# Patient Record
Sex: Female | Born: 1980 | Race: Black or African American | Hispanic: No | Marital: Single | Smoking: Former smoker
Health system: Southern US, Community
[De-identification: ages and names within clinical notes are randomized; demographics above are authoritative.]

## PROBLEM LIST (undated history)

## (undated) ENCOUNTER — Inpatient Hospital Stay (HOSPITAL_COMMUNITY): Payer: Self-pay

## (undated) DIAGNOSIS — I10 Essential (primary) hypertension: Secondary | ICD-10-CM

## (undated) DIAGNOSIS — F329 Major depressive disorder, single episode, unspecified: Secondary | ICD-10-CM

## (undated) DIAGNOSIS — R87629 Unspecified abnormal cytological findings in specimens from vagina: Secondary | ICD-10-CM

## (undated) DIAGNOSIS — Z789 Other specified health status: Secondary | ICD-10-CM

## (undated) HISTORY — DX: Essential (primary) hypertension: I10

## (undated) HISTORY — DX: Unspecified abnormal cytological findings in specimens from vagina: R87.629

## (undated) HISTORY — PX: TUBAL LIGATION: SHX77

## (undated) HISTORY — PX: NO PAST SURGERIES: SHX2092

---

## 2006-11-04 ENCOUNTER — Inpatient Hospital Stay (HOSPITAL_COMMUNITY): Admission: AD | Admit: 2006-11-04 | Discharge: 2006-11-04 | Payer: Self-pay | Admitting: Obstetrics & Gynecology

## 2008-09-01 ENCOUNTER — Ambulatory Visit (HOSPITAL_COMMUNITY): Admission: RE | Admit: 2008-09-01 | Discharge: 2008-09-01 | Payer: Self-pay | Admitting: Obstetrics

## 2008-11-03 ENCOUNTER — Ambulatory Visit (HOSPITAL_COMMUNITY): Admission: RE | Admit: 2008-11-03 | Discharge: 2008-11-03 | Payer: Self-pay | Admitting: Obstetrics

## 2008-12-09 ENCOUNTER — Observation Stay (HOSPITAL_COMMUNITY): Admission: AD | Admit: 2008-12-09 | Discharge: 2008-12-10 | Payer: Self-pay | Admitting: Obstetrics & Gynecology

## 2008-12-20 ENCOUNTER — Inpatient Hospital Stay (HOSPITAL_COMMUNITY): Admission: AD | Admit: 2008-12-20 | Discharge: 2008-12-20 | Payer: Self-pay | Admitting: Obstetrics & Gynecology

## 2008-12-26 ENCOUNTER — Inpatient Hospital Stay (HOSPITAL_COMMUNITY): Admission: AD | Admit: 2008-12-26 | Discharge: 2008-12-28 | Payer: Self-pay | Admitting: Obstetrics

## 2011-03-20 LAB — CBC
HCT: 33.4 % — ABNORMAL LOW (ref 36.0–46.0)
HCT: 32.2 % — ABNORMAL LOW (ref 36.0–46.0)
HCT: 35.9 % — ABNORMAL LOW (ref 36.0–46.0)
Hemoglobin: 11.4 g/dL — ABNORMAL LOW (ref 12.0–15.0)
MCHC: 34.1 g/dL (ref 30.0–36.0)
MCV: 100.3 fL — ABNORMAL HIGH (ref 78.0–100.0)
MCV: 98.7 fL (ref 78.0–100.0)
Platelets: 137 10*3/uL — ABNORMAL LOW (ref 150–400)
RBC: 3.26 MIL/uL — ABNORMAL LOW (ref 3.87–5.11)
RBC: 3.58 MIL/uL — ABNORMAL LOW (ref 3.87–5.11)
WBC: 10.2 10*3/uL (ref 4.0–10.5)
WBC: 10.6 10*3/uL — ABNORMAL HIGH (ref 4.0–10.5)
WBC: 11.1 10*3/uL — ABNORMAL HIGH (ref 4.0–10.5)

## 2011-03-20 LAB — RPR: RPR Ser Ql: NONREACTIVE

## 2011-03-20 LAB — COMPREHENSIVE METABOLIC PANEL
Albumin: 2.7 g/dL — ABNORMAL LOW (ref 3.5–5.2)
Chloride: 106 mEq/L (ref 96–112)
GFR calc non Af Amer: >60 mL/min (ref >60)
Potassium: 3.4 mEq/L — ABNORMAL LOW (ref 3.5–5.1)
Sodium: 135 mEq/L (ref 135–145)
Total Bilirubin: 0.7 mg/dL (ref 0.3–1.2)
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
## 2012-05-17 ENCOUNTER — Encounter (HOSPITAL_COMMUNITY): Payer: Self-pay | Admitting: Emergency Medicine

## 2012-05-17 ENCOUNTER — Emergency Department (HOSPITAL_COMMUNITY)
Admission: EM | Admit: 2012-05-17 | Discharge: 2012-05-17 | Disposition: A | Discharge status: Home / Self Care | Payer: No Typology Code available for payment source | Attending: Emergency Medicine | Admitting: Emergency Medicine

## 2012-05-17 DIAGNOSIS — S139XXA Sprain of joints and ligaments of unspecified parts of neck, initial encounter: Secondary | ICD-10-CM | POA: Insufficient documentation

## 2012-05-17 DIAGNOSIS — T148XXA Other injury of unspecified body region, initial encounter: Secondary | ICD-10-CM

## 2012-05-17 DIAGNOSIS — Y92410 Unspecified street and highway as the place of occurrence of the external cause: Secondary | ICD-10-CM | POA: Insufficient documentation

## 2012-05-17 MED ORDER — NAPROXEN 500 MG PO TABS: 500.0000 mg | ORAL_TABLET | Freq: Two times a day (BID) | ORAL | Status: AC

## 2012-05-17 MED ORDER — CYCLOBENZAPRINE HCL 10 MG PO TABS: 10.0000 mg | ORAL_TABLET | Freq: Three times a day (TID) | ORAL | Status: AC | PRN
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
## 2012-05-18 NOTE — ED Provider Notes (Signed)
Medical screening examination/treatment/procedure(s) were performed by non-physician practitioner and as supervising physician I was immediately available for consultation/collaboration.   Forbes Cellar, MD 05/18/12 1627

## 2013-12-04 NOTE — L&D Delivery Note (Signed)
Delivery Note At 5:10 PM a viable female was delivered via Vaginal, Spontaneous Delivery (Presentation: ;  ).  APGAR: , ; weight .   Placenta status: , .  Cord:  with the following complications: .  Cord pH: not done  Anesthesia:   Episiotomy:  Lacerations:  Suture Repair: 2.0 Est. Blood Loss (mL):   Mom to postpartum.  Baby to Couplet care / Skin to Skin.  MARSHALL,BERNARD A 09/11/2014, 5:18 PM

## 2014-01-05 ENCOUNTER — Emergency Department (HOSPITAL_COMMUNITY)
Admission: EM | Admit: 2014-01-05 | Discharge: 2014-01-05 | Disposition: A | Discharge status: Home / Self Care | Payer: Medicaid Other | Attending: Emergency Medicine | Admitting: Emergency Medicine

## 2014-01-05 ENCOUNTER — Encounter (HOSPITAL_COMMUNITY): Payer: Self-pay | Admitting: Emergency Medicine

## 2014-01-05 DIAGNOSIS — J029 Acute pharyngitis, unspecified: Secondary | ICD-10-CM | POA: Insufficient documentation

## 2014-01-05 DIAGNOSIS — R059 Cough, unspecified: Secondary | ICD-10-CM

## 2014-01-05 DIAGNOSIS — F17200 Nicotine dependence, unspecified, uncomplicated: Secondary | ICD-10-CM | POA: Insufficient documentation

## 2014-01-05 DIAGNOSIS — R05 Cough: Secondary | ICD-10-CM

## 2014-01-05 DIAGNOSIS — R0602 Shortness of breath: Secondary | ICD-10-CM | POA: Insufficient documentation

## 2014-01-05 MED ORDER — BENZONATATE 100 MG PO CAPS: 100.0000 mg | ORAL_CAPSULE | Freq: Three times a day (TID) | ORAL | Status: DC

## 2014-01-05 MED ORDER — ALBUTEROL SULFATE HFA 108 (90 BASE) MCG/ACT IN AERS
2.0000 | INHALATION_SPRAY | Freq: Four times a day (QID) | RESPIRATORY_TRACT | Status: DC | PRN
  Administered 2014-01-05: 2 via RESPIRATORY_TRACT
  Filled 2014-01-05: qty 6.7

## 2014-01-05 MED ORDER — DEXAMETHASONE SODIUM PHOSPHATE 10 MG/ML IJ SOLN
10.0000 mg | Freq: Once | INTRAMUSCULAR | Status: AC
  Administered 2014-01-05: 10 mg via INTRAMUSCULAR
  Filled 2014-01-05: qty 1

## 2014-01-05 NOTE — ED Notes (Signed)
Pt reports nonproductive cough x1 month, reports last night had coughing fit and now throat hurts today, pain 7/10. Denies pain upon swallowing. Lung sounds clear.

## 2014-01-05 NOTE — Discharge Instructions (Signed)
Use inhaler as directed for any episodes of coughing or shortness of breath. Take cough medicine as directed. Follow up with primary care provider in 2 days. Refer to resource guide below for follow up. Return to ED should you develop any Chest pain or Shortness of breath.    Emergency Department Resource Guide 1) Find a Doctor and Pay Out of Pocket Although you won't have to find out who is covered by your insurance plan, it is a good idea to ask around and get recommendations. You will then need to call the office and see if the doctor you have chosen will accept you as a new patient and what types of options they offer for patients who are self-pay. Some doctors offer discounts or will set up payment plans for their patients who do not have insurance, but you will need to ask so you aren't surprised when you get to your appointment.  2) Contact Your Local Health Department Not all health departments have doctors that can see patients for sick visits, but many do, so it is worth a call to see if yours does. If you don't know where your local health department is, you can check in your phone book. The CDC also has a tool to help you locate your state's health department, and many state websites also have listings of all of their local health departments.  3) Find a Walk-in Clinic If your illness is not likely to be very severe or complicated, you may want to try a walk in clinic. These are popping up all over the country in pharmacies, drugstores, and shopping centers. They're usually staffed by nurse practitioners or physician assistants that have been trained to treat common illnesses and complaints. They're usually fairly quick and inexpensive. However, if you have serious medical issues or chronic medical problems, these are probably not your best option.  No Primary Care Doctor: - Call Health Connect at  [phone] - they can help you locate a primary care doctor that  accepts your insurance,  provides certain services, etc. - Physician Referral Service- [phone]  Chronic Pain Problems: Organization         Address  Phone   Notes  [facility]  [phone] Patients need to be referred by their primary care doctor.   Medication Assistance: Organization         Address  Phone   Notes  [facility] [address] [place], [place] [address] [phone]) [phone] --Must be a resident of [facility] [place] -- Must have NO insurance coverage whatsoever (no Medicaid/ Medicare, etc.) -- The pt. MUST have a primary care doctor that directs their care regularly and follows them in the community   MedAssist  [phone]   [employer]  [phone]    Agencies that provide inexpensive medical care: Organization         Address  Phone   Notes  [facility]  [phone]   [facility]    [phone]) [phone]   [facility] [address] [place], [place] [address] [phone]) [phone]   [facility] [address], [place] [phone]   [facility]anned Parenthood    [phone]) [phone]   [facility]    [phone]   [facility] and [facility]  [address], [place] Phone:  [phone]) [phone], Fax:  [phone] Hours of Operation:  9 am -  6 pm, M-F.  Also accepts Medicaid/Medicare and self-pay.  [facility] for [place] [address], [address], [place] Phone: [phone], Fax: [phone]. Hours of Operation:  8:30 am - 5:30 pm, M-F.  Also accepts Medicaid and self-pay.  [facility] [place] [address] [place] Phone: [phone]   [place], [place], [place] [phone], Ext. [phone] Mondays & Thursdays: 7-9 AM.  First 15 patients are seen on a first come, first serve basis.    [place] Providers:  Organization         Address  Phone    Notes  [facility] [address], [place] [phone] Also accepts self-pay patients.  [facility] [address], [place]  [phone]   [place], [address], [place] [phone]   [facility] [address], [place] [phone]   Lucianne Lei [address], [place]   [phone] Only accepts [place] Access [place] patients after they have their name applied to their card.   Self-Pay (no insurance) in [facility]:  Organization         Address  Phone   Notes  Sickle Cell Patients, [facility] [place] [phone]   [facility] [place] [phone]   [facility] [place]  [place], [place], [place] [phone]   [facility]/Dr. Osei-Bonsu  [address] [place] or [address], [place] [phone] Phone number for both [place] and [place] locations is the same.  [facility] [address] [place] [phone]   [facility] [address], [place] or [address] [phone]   [facility] [address] [place] [phone], phone; [phone], fax Sees patients 1st and 3rd Saturday of every month.  Must not qualify for public or private insurance (i.e. Medicaid, Medicare, [place] Health Choice, Veterans' Benefits)  Household income should be no more than 200% of the poverty level The clinic cannot treat you if you are pregnant or think you are pregnant  Sexually transmitted diseases are not treated at the clinic.    Dental Care: Organization         Address  Phone  Notes  [facility] of [facility] [place] [phone] Accepts children up to age 44 who are enrolled in [place] or [place]; pregnant women with a Medicaid card; and children who have applied for Medicaid or [place] Health Choice, but were declined, whose parents can pay a reduced fee at time of service.  [facility] Department of [facility]  [address], [place] [phone] Accepts children up to age 76 who are enrolled in [place] or [place]; pregnant women with a Medicaid card; and children who have applied for Medicaid or [place] Health Choice, but were declined, whose parents can pay a reduced fee at time of service.  [place] Access PROGRAM  [place] [phone] Patients are seen by appointment only. Walk-ins are not accepted. [place] will see patients 53 years of age and  older. Monday - Tuesday (8am-5pm) Most Wednesdays (8:30-5pm) $30 per visit, cash only  [facility] Adult [employer] PROGRAM  [address], [facility] [phone] Patients are seen by appointment only. Walk-ins are not accepted. [place] will see patients 46 years of age and older. One Wednesday Evening (Monthly: Volunteer Based).  $30 per visit, cash only  [place]  [phone] for adults; Children under age 44, call Graduate Pediatric Dentistry at [phone]. Children aged 62-14, please call [phone] to request a pediatric application.  Dental services are provided in all areas of dental care including fillings, crowns and bridges, complete and partial dentures, implants, gum treatment, root canals, and extractions. Preventive care is also provided. Treatment is provided to both adults and children. Patients are selected via a lottery and there is often a waiting list.   [facility] [address] [place]  [phone] [email or web address]   [facility] [address] [place], [place] [phone], Ext.  123 Second and Fourth Thursday of each month, opens at 6:30 AM; Clinic ends at 9 AM.  Patients are seen on a first-come first-served basis, and a limited number are seen during each clinic.   [facility]  [address] Hillard Danker [place], [place] [phone]   Eligibility Requirements You must have lived in [place], [place], or [place] counties for at least the last three months.   You cannot be eligible for state or federal sponsored [employer], including [employer], [place], or [employer].   You generally cannot be eligible for healthcare insurance through your employer.    How to apply: Eligibility screenings are held every Tuesday and Wednesday afternoon from 1:00 pm until 4:00 pm. You do not need an appointment for the interview!  [facility] [address] [place], [place]   [place]  [place] Department  [place]  [phone]    Behavioral Health Resources in the Community: Intensive Outpatient Programs Organization         Address  Phone  Notes  [place]avinia Dateland. [address] [place], [place] [phone]   [facility] [address], [place], [place]   ADS: Alcohol [facility] Drug S[facility] [address] [place], [place]   [place] [address],  [place], [place] or [phone]   Substance Abuse Resources Organization         Address  Phone  Notes  Alcohol and Drug Services  [phone]   [place]  [phone]   The [place]   Chinita Pester  [phone]   Residential & Outpatient Substance Abuse Program  [phone]   Psychological Services Organization         Address  Phone  Notes  [facility]  [place]  [phone]   [place] [address], [place] [phone] or [phone]    Mobile Crisis Teams Organization         Address  Phone  Notes  [facility], Mobile Crisis Care Unit  [phone]   [facility] Psychotherapeutic [facility]  [address] [place], [place]   [facility] [address], [place] [phone]    Self-Help/Support Groups Organization         Address  Phone             Notes  [facility] of [place] - variety of support groups  [place] Call for more information  Narcotics Anonymous (NA), [facility] [address], [employer] [place]  2 meetings at this location   Special educational needs teacher         Address  Phone  Notes  [facility] [place],    [place]  [phone]   [facility]  [address] [place] 469629, [place], [place]   [place], [place] [phone] Admissions: 8am-3pm M-F  Incentives Substance [place] [address]    [place], [place] [phone]   [facility] [address] [place], [place], [place]   [facility] [address],  [place], [place]   [facility] - Intensive Outpatient [address]een [address], [place], [place]   [facility] ([place].,  [place], [place] [phone] or [phone]   [facility] (RTS) [address] [place], [place] Accepts Medicaid  [facility] [place] [address],  [place] [phone] Substance Abuse/Addiction Treatment   [facility] Organization         Address  Phone  Notes  [facility]  [phone]   Domenic Schwab, PhD [address] Arlis [place], [place]   [phone] or [phone]   [place] H[place]man  [place], [place] [phone]   [facility] [address] [place], [place] [phone] Insurance/Medicaid/sponsorship through [facility] and Families [address]                                    [place], [place] [phone] [place] [address], [place] [phone]    Dr. Adele Schilder  [phone]   [facility] Dept. 1) [address], [place] 2) [place] 3)  [place] [address], [place] [phone]  [phone]   [place] [phone] or [phone] (After Hours)

## 2014-01-05 NOTE — ED Notes (Signed)
Pt states for the past month she's had a cough, that's worse at night, states on the L side of her throat it feels scratchy and itchy but only when coughing, denies pain w/ swallowing. Pt states she has been using cough drops and that's it.

## 2014-01-05 NOTE — ED Provider Notes (Signed)
CSN: [id number]     Arrival date & time 01/05/14  [id number] History   First MD Initiated Contact with Patient 01/05/14 [phone]     Chief Complaint  Patient presents with  . Cough  . throat pain    (Consider location/radiation/quality/duration/timing/severity/associated sxs/prior Treatment) Patient is a 33 y.o. female presenting with cough.  Cough  33 yo female with 10 pack year hx of smoking, presents with dry nonproductive cough x 1 month that has not changed. Patient states she has tried OTC cough syrups without any relief. Patient admits to sore throat today worsened by cough. Admits to occasional shortness of breath. Denies DOE, or orthopnea. Denies any current Chest pain or SOB. No congestion, fever/chills, eye or ear pain, runny nose, N/V/D/C. Patient has not been seen for cough before. Patient denies any PMH or family hx.   Age > 33 yo: No HR > 100 bpm: No O2 sat on RA < 95%: No Prior hx of venous thromboembolism:No Trauma or surgery in past 4 wks:No Hemoptysis:No Exogenous Estrogen use:No Unilateral Leg swelling: No       History reviewed. No pertinent past medical history. History reviewed. No pertinent past surgical history. History reviewed. No pertinent family history. History  Substance Use Topics  . Smoking status: Current Every Day Smoker -- 1.00 packs/day  . Smokeless tobacco: Never Used  . Alcohol Use: Yes     Comment: socially   OB History   Grav Para Term Preterm Abortions TAB SAB Ect Mult Living                 Review of Systems  Respiratory: Positive for cough.   All other systems reviewed and are negative.    Allergies  Review of patient's allergies indicates no known allergies.  Home Medications   Current Outpatient Rx  Name  Route  Sig  Dispense  Refill  . benzonatate (TESSALON) 100 MG capsule   Oral   Take 1 capsule (100 mg total) by mouth every 8 (eight) hours.   21 capsule   0    BP 116/80  Pulse 74  Temp(Src) 98.5 F (36.9 C) (Oral)   Resp 16  SpO2 100%  LMP 01/01/2014 Physical Exam  Nursing note and vitals reviewed. Constitutional: She is oriented to person, place, and time. She appears well-developed and well-nourished. No distress.  HENT:  Head: Normocephalic and atraumatic.  Eyes: Conjunctivae and EOM are normal.  Neck: Normal range of motion. Neck supple. No JVD present.  Cardiovascular: Normal rate, regular rhythm and normal heart sounds.  Exam reveals no gallop and no friction rub.   No murmur heard. Pulmonary/Chest: Effort normal and breath sounds normal. No respiratory distress. She has no wheezes. She has no rales. She exhibits no tenderness.  Musculoskeletal: Normal range of motion. She exhibits no edema.  Neurological: She is alert and oriented to person, place, and time.  Skin: Skin is warm and dry. No rash noted. She is not diaphoretic.  Psychiatric: She has a normal mood and affect. Her behavior is normal.    ED Course  Procedures (including critical care time) Labs Review Labs Reviewed - No data to display Imaging Review No results found.  EKG Interpretation   None       MDM   1. Cough    Patient afebrile with normal VS.  Patient is PERC negative.  Doubt cardiac etiology. Patient has minimal risk.  Plan to treat patient's symptoms and have patient follow up with PCP in 2  days.   Meds given in ED:  Medications  dexamethasone (DECADRON) injection 10 mg (10 mg Intramuscular Given 01/05/14 0955)    Discharge Medication List as of 01/05/2014  9:47 AM    START taking these medications   Details  benzonatate (TESSALON) 100 MG capsule Take 1 capsule (100 mg total) by mouth every 8 (eight) hours., Starting 01/05/2014, Until Discontinued, Print           Rudene Anda, [place] 01/06/14 2252

## 2014-01-09 NOTE — ED Provider Notes (Signed)
Medical screening examination/treatment/procedure(s) were performed by non-physician practitioner and as supervising physician I was immediately available for consultation/collaboration.  Eiman Maret L Kenzi Bardwell, MD 01/09/14 0014 

## 2014-03-02 ENCOUNTER — Encounter (HOSPITAL_COMMUNITY): Payer: Self-pay | Admitting: Emergency Medicine

## 2014-03-02 ENCOUNTER — Emergency Department (HOSPITAL_COMMUNITY)
Admission: EM | Admit: 2014-03-02 | Discharge: 2014-03-02 | Disposition: A | Discharge status: Home / Self Care | Payer: Medicaid Other | Attending: Emergency Medicine | Admitting: Emergency Medicine

## 2014-03-02 ENCOUNTER — Emergency Department (HOSPITAL_COMMUNITY): Payer: Medicaid Other

## 2014-03-02 DIAGNOSIS — H7391 Unspecified disorder of tympanic membrane, right ear: Secondary | ICD-10-CM

## 2014-03-02 DIAGNOSIS — F17200 Nicotine dependence, unspecified, uncomplicated: Secondary | ICD-10-CM | POA: Insufficient documentation

## 2014-03-02 DIAGNOSIS — R0609 Other forms of dyspnea: Secondary | ICD-10-CM | POA: Insufficient documentation

## 2014-03-02 DIAGNOSIS — J3489 Other specified disorders of nose and nasal sinuses: Secondary | ICD-10-CM | POA: Insufficient documentation

## 2014-03-02 DIAGNOSIS — H9209 Otalgia, unspecified ear: Secondary | ICD-10-CM | POA: Insufficient documentation

## 2014-03-02 DIAGNOSIS — R0600 Dyspnea, unspecified: Secondary | ICD-10-CM

## 2014-03-02 DIAGNOSIS — H748X9 Other specified disorders of middle ear and mastoid, unspecified ear: Secondary | ICD-10-CM | POA: Insufficient documentation

## 2014-03-02 DIAGNOSIS — R0989 Other specified symptoms and signs involving the circulatory and respiratory systems: Principal | ICD-10-CM | POA: Insufficient documentation

## 2014-03-02 NOTE — ED Provider Notes (Signed)
CSN: [id number]     Arrival date & time 03/02/14  0949 History  This chart was scribed for non-physician practitioner, Mellody DrownLauren Bana Borgmeyer, PA-C working with Audree CamelScott T Goldston, MD by Greggory StallionKayla Andersen, ED scribe. This patient was seen in room TR05C/TR05C and the patient's care was started at 10:12 AM.   Chief Complaint  Patient presents with  . Shortness of Breath   HPI Comments: Mckenzie [name] is a 33 y.o. female with a past medical history of tobacco abuse presenting to the Emergency Department with a chief complaint of intermittent dyspnea that started 2 weeks ago.  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 She states SOB occurs with exertion and lasts about 20 minutes. Sitting down relieves SOB. Pt has also been having "burning" right ear pain. Denies congestion, hearing loss, cough, chest pain, leg swelling, sore throat, fever, abdominal pain, nausea, emesis. Denies history of heart murmur or asthma. No use of ACE inhibitor.  The history is provided by the patient. No language interpreter was used.    History reviewed. No pertinent past medical history. History reviewed. No pertinent past surgical history. History reviewed. No pertinent family history. History  Substance Use Topics  . Smoking status: Current Every Day Smoker -- 1.00 packs/day  . Smokeless tobacco: Never Used  . Alcohol Use: Yes     Comment: socially   OB History   Grav Para Term Preterm Abortions TAB SAB Ect Mult Living                 Review of Systems  Constitutional: Negative for fever.  HENT: Positive for ear pain. Negative for congestion and hearing loss.   Respiratory: Positive for shortness of breath. Negative for cough.   Cardiovascular: Negative for chest pain, palpitations and leg swelling.  Gastrointestinal: Negative for nausea, vomiting and abdominal pain.  All other systems reviewed and are negative.   Allergies  Review of patient's allergies indicates no known allergies.  Home  Medications   Current Outpatient Rx  Name  Route  Sig  Dispense  Refill  . benzonatate (TESSALON) 100 MG capsule   Oral   Take 1 capsule (100 mg total) by mouth every 8 (eight) hours.   21 capsule   0    BP 102/67  Pulse 81  Temp(Src) 97.5 F (36.4 C) (Oral)  Resp 18  Ht 5\' 6"  (1.676 m)  Wt 115 lb 4.8 oz (52.3 kg)  BMI 18.62 kg/m2  SpO2 100%  LMP 02/01/2014  Physical Exam  Nursing note and vitals reviewed. Constitutional: She is oriented to person, place, and time. She appears well-developed and well-nourished. No distress.  HENT:  Head: Normocephalic and atraumatic.  Right Ear: Ear canal normal. No mastoid tenderness. No middle ear effusion.  Left Ear: Tympanic membrane and ear canal normal. No mastoid tenderness.  No middle ear effusion.  Nose: Rhinorrhea present. Right sinus exhibits no maxillary sinus tenderness and no frontal sinus tenderness. Left sinus exhibits no maxillary sinus tenderness and no frontal sinus tenderness.  Mouth/Throat: Uvula is midline and oropharynx is clear and moist. No trismus in the jaw.  Right TM increase in white discoloration.   Eyes: EOM are normal.  Neck: Neck supple.  Cardiovascular: Normal rate, regular rhythm and normal heart sounds.   No murmur heard. No lower extremity swelling  Pulmonary/Chest: Effort normal and breath sounds normal. No respiratory distress. She has no wheezes. She has no rales.  Patient is able to speak in complete sentences.  Abdominal: Soft. There is no tenderness. There is no rebound and no guarding.  Musculoskeletal: Normal range of motion. She exhibits no edema.  Lymphadenopathy:       Head (right side): No submental, no submandibular, no tonsillar, no preauricular, no posterior auricular and no occipital adenopathy present.       Head (left side): No submental, no submandibular, no tonsillar, no preauricular, no posterior auricular and no occipital adenopathy present.    She has no cervical adenopathy.   Neurological: She is alert and oriented to person, place, and time.  Skin: Skin is warm and dry. She is not diaphoretic.  Psychiatric: She has a normal mood and affect. Her behavior is normal.    ED Course  Procedures (including critical care time)  DIAGNOSTIC STUDIES: Oxygen Saturation is 100% on RA, normal by my interpretation.    COORDINATION OF CARE: 10:17 AM-Discussed treatment plan which includes chest xray with pt at bedside and pt agreed to plan.   Labs Review Labs Reviewed - No data to display Imaging Review Dg Chest 2 View  03/02/2014   CLINICAL DATA:  Shortness of breath.  EXAM: CHEST  2 VIEW  COMPARISON:  None.  FINDINGS: The lungs are clear. Heart size is normal. No pneumothorax or pleural effusion. No focal bony abnormality.  IMPRESSION: Negative chest.   Electronically Signed   By: Drusilla Kanner M.D.   On: 03/02/2014 10:41     EKG Interpretation None      Date: 03/02/2014  Rate: 82  Rhythm: normal sinus rhythm  QRS Axis: normal  Intervals: normal  ST/T Wave abnormalities: normal  Conduction Disutrbances:none  Narrative Interpretation:   Old EKG Reviewed:        MDM   Final diagnoses:  Dyspnea  Abnormal tympanic membrane of right ear   Pt with intermittent SOB. Oxygen 100% RA, no tachycardia. No lower extremity edema.  XR without acute abnormalities.  EKG, NSR without abnormalities. Questionable chemical irritation. Will have pt follow up with ENT and a PCP. Discussed lab results, imaging results, and treatment plan with the patient. Return precautions given. Reports understanding and no other concerns at this time.  Patient is stable for discharge at this time.  Meds given in ED:  Medications - No data to display  New Prescriptions   No medications on file    I personally performed the services described in this documentation, which was scribed in my presence. The recorded information has been reviewed and is accurate.    Clabe Seal,  PA-C 03/02/14 1114

## 2014-03-02 NOTE — ED Notes (Addendum)
Pt reports SOB and generalized feeling bad for several weeks; ear pain; smoker but does not take BC. Talking in full sentences and not SOB when ambulating or at rest. Lung sounds clear.

## 2014-03-02 NOTE — Discharge Instructions (Signed)
Call for a follow up appointment with a Family or Primary Care Provider.  Follow up with Dr. Annalee GentaShoemaker for further evaluation of your abnormal tympanic membrane (ear drum). Avoid chemical exposure that worsen your breathing. Return to the Emergency Department if symptoms worsen.   Take medication as prescribed.

## 2014-03-03 NOTE — ED Provider Notes (Signed)
Medical screening examination/treatment/procedure(s) were performed by non-physician practitioner and as supervising physician I was immediately available for consultation/collaboration.   EKG Interpretation None        Audree CamelScott T Timothy Townsel, MD 03/03/14 [phone]

## 2014-03-22 ENCOUNTER — Inpatient Hospital Stay (HOSPITAL_COMMUNITY): Payer: Medicaid Other

## 2014-03-22 ENCOUNTER — Inpatient Hospital Stay (HOSPITAL_COMMUNITY)
Admission: AD | Admit: 2014-03-22 | Discharge: 2014-03-22 | Disposition: A | Discharge status: Home / Self Care | Payer: Medicaid Other | Source: Ambulatory Visit | Attending: Obstetrics & Gynecology | Admitting: Obstetrics & Gynecology

## 2014-03-22 ENCOUNTER — Encounter (HOSPITAL_COMMUNITY): Payer: Self-pay

## 2014-03-22 DIAGNOSIS — R109 Unspecified abdominal pain: Secondary | ICD-10-CM | POA: Insufficient documentation

## 2014-03-22 DIAGNOSIS — O9989 Other specified diseases and conditions complicating pregnancy, childbirth and the puerperium: Principal | ICD-10-CM

## 2014-03-22 DIAGNOSIS — O26899 Other specified pregnancy related conditions, unspecified trimester: Secondary | ICD-10-CM

## 2014-03-22 DIAGNOSIS — O99891 Other specified diseases and conditions complicating pregnancy: Secondary | ICD-10-CM | POA: Insufficient documentation

## 2014-03-22 DIAGNOSIS — O99330 Smoking (tobacco) complicating pregnancy, unspecified trimester: Secondary | ICD-10-CM | POA: Insufficient documentation

## 2014-03-22 HISTORY — DX: Other specified health status: Z78.9

## 2014-03-22 LAB — WET PREP, GENITAL
CLUE CELLS WET PREP: NONE SEEN
TRICH WET PREP: NONE SEEN
Yeast Wet Prep HPF POC: NONE SEEN

## 2014-03-22 LAB — URINE MICROSCOPIC-ADD ON

## 2014-03-22 LAB — URINALYSIS, ROUTINE W REFLEX MICROSCOPIC
Bilirubin Urine: NEGATIVE
Glucose, UA: NEGATIVE mg/dL
Ketones, ur: 15 mg/dL — AB
LEUKOCYTES UA: NEGATIVE
NITRITE: NEGATIVE
Protein, ur: NEGATIVE mg/dL
SPECIFIC GRAVITY, URINE: 1.02 (ref 1.005–1.030)
UROBILINOGEN UA: 1 mg/dL (ref 0.0–1.0)
pH: 6 (ref 5.0–8.0)

## 2014-03-22 LAB — POCT PREGNANCY, URINE: Preg Test, Ur: POSITIVE — AB

## 2014-03-22 NOTE — MAU Provider Note (Signed)
History     CSN: [id number]  Arrival date and time: 03/22/14 [id number]   First Provider Initiated Contact with Patient 03/22/14 2003      Chief Complaint  Patient presents with  . Abdominal Cramping   HPI Mckenzie [name] is a 33 y.o. G5P4004 at 6047w0d who presents to MAU today with complaint of severe abdominal pain since this morning. The patient describes the pain as cramping with sharp pains that come and go. She states that it will make her stop and she has had incontinence. She denies vaginal bleeding, discharge, fever, UTI symptoms or N/V/D. Patient has an appointment to start care with [facility] this week.   OB History   Grav Para Term Preterm Abortions TAB SAB Ect Mult Living   5 4 4       4       Past Medical History  Diagnosis Date  . Medical history non-contributory     Past Surgical History  Procedure Laterality Date  . No past surgeries      History reviewed. No pertinent family history.  History  Substance Use Topics  . Smoking status: Current Every Day Smoker -- 1.00 packs/day    Types: Cigarettes  . Smokeless tobacco: Never Used  . Alcohol Use: No     Comment: socially    Allergies: No Known Allergies  Prescriptions prior to admission  Medication Sig Dispense Refill  . Multiple Vitamin (MULTIVITAMIN WITH MINERALS) TABS tablet Take 1 tablet by mouth 2 (two) times daily.      . Prenatal Vit-Fe Fumarate-FA (PRENATAL MULTIVITAMIN) TABS tablet Take 1 tablet by mouth daily at 12 noon.        Review of Systems  Constitutional: Negative for fever and malaise/fatigue.  Gastrointestinal: Positive for abdominal pain. Negative for nausea, vomiting, diarrhea and constipation.  Genitourinary: Positive for dysuria. Negative for urgency and frequency.       Neg - vaginal bleeding, discharge  Neurological: Positive for dizziness. Negative for loss of consciousness and weakness.   Physical Exam   Blood pressure 104/56, pulse 77, temperature 98 F (36.7 C), resp.  rate 16, height 5\' 6"  (1.676 m), weight 49.442 kg (109 lb), last menstrual period 01/04/2014, SpO2 100.00%.  Physical Exam  Constitutional: She is oriented to person, place, and time. She appears well-developed and well-nourished. No distress.  HENT:  Head: Normocephalic and atraumatic.  Cardiovascular: Normal rate.   Respiratory: Effort normal.  GI: Soft. Bowel sounds are normal. She exhibits no distension and no mass. There is no tenderness. There is no rebound and no guarding.  Genitourinary: Uterus is enlarged (appropriate for GA). Uterus is not tender. Cervix exhibits no motion tenderness, no discharge and no friability. Right adnexum displays no mass and no tenderness. Left adnexum displays no mass and no tenderness. No bleeding around the vagina. Vaginal discharge (scant thin, white discharge noted) found.  Neurological: She is alert and oriented to person, place, and time.  Skin: Skin is warm and dry. No erythema.  Psychiatric: She has a normal mood and affect.   Results for orders placed during the hospital encounter of 03/22/14 (from the past 24 hour(s))  URINALYSIS, ROUTINE W REFLEX MICROSCOPIC     Status: Abnormal   Collection Time    03/22/14  7:40 PM      Result Value Ref Range   Color, Urine YELLOW  YELLOW   APPearance CLEAR  CLEAR   Specific Gravity, Urine 1.020  1.005 - 1.030   pH 6.0  5.0 - 8.0   Glucose, UA NEGATIVE  NEGATIVE mg/dL   Hgb urine dipstick LARGE (*) NEGATIVE   Bilirubin Urine NEGATIVE  NEGATIVE   Ketones, ur 15 (*) NEGATIVE mg/dL   Protein, ur NEGATIVE  NEGATIVE mg/dL   Urobilinogen, UA 1.0  0.0 - 1.0 mg/dL   Nitrite NEGATIVE  NEGATIVE   Leukocytes, UA NEGATIVE  NEGATIVE  URINE MICROSCOPIC-ADD ON     Status: Abnormal   Collection Time    03/22/14  7:40 PM      Result Value Ref Range   Squamous Epithelial / LPF FEW (*) RARE   WBC, UA 0-2  <3 WBC/hpf   RBC / HPF 11-20  <3 RBC/hpf   Bacteria, UA FEW (*) RARE   Urine-Other MUCOUS PRESENT    POCT  PREGNANCY, URINE     Status: Abnormal   Collection Time    03/22/14  7:53 PM      Result Value Ref Range   Preg Test, Ur POSITIVE (*) NEGATIVE  WET PREP, GENITAL     Status: Abnormal   Collection Time    03/22/14  8:15 PM      Result Value Ref Range   Yeast Wet Prep HPF POC NONE SEEN  NONE SEEN   Trich, Wet Prep NONE SEEN  NONE SEEN   Clue Cells Wet Prep HPF POC NONE SEEN  NONE SEEN   WBC, Wet Prep HPF POC MODERATE (*) NONE SEEN    MAU Course  Procedures None  MDM FHR - 156 bpm with doppler + UPT UA, wet prep, GC/Chlamydia today Discussed patient and lab results with Dr. Erin FullingHarraway-Smith. Get US today because of severity of patient's pain.  2100 - Patient in US. [facility], [place]  Freddi StarrJulie N Ethier 03/22/2014, 8:47 PM   Ultrasound results: FINDINGS:  Intrauterine gestational sac: Single gestational sac. Fundal.  Yolk sac: Absent.  Embryo: Present.  Cardiac Activity: Present.  Heart Rate: 156 bpm  MSD: mm w d  CRL: 59 mm 12 w 3 d US EDC: 10/01/2014  Maternal uterus/adnexae: No subchorionic hemorrhage. Left ovary is  unremarkable. Right ovary cannot be visualized. No free fluid.  IMPRESSION:  Single live intrauterine gestation with an estimated gestational age  of [redacted] weeks and 3 days. Fetal heart rate is 156 beats per min.  Right ovary could not be visualized.   Informed by ultrasound tech cannot obtain cervical length at 12 wk IUP  2150 Pt reports pain is no longer present.  Sitting in chair drinking apple juice with no signs of acute distress.   Assessment and Plan   33 yo G5P4004 at 5021w3d wks IUP Abdominal Pain in Pregnancy  Plan: Discharge to home Advised to increase fluids [facility]  Walidah N Muhammad, CNM 03/22/2014 2153

## 2014-03-22 NOTE — MAU Note (Signed)
Patient complains of abdominal cramping that started this morning. Denies vaginal bleeding and vaginal discharge. Not sure of LMP, sometime the beginning of February. +HPT

## 2014-03-23 LAB — GC/CHLAMYDIA PROBE AMP
CT Probe RNA: NEGATIVE
GC PROBE AMP APTIMA: NEGATIVE

## 2014-03-24 ENCOUNTER — Encounter: Payer: Self-pay | Admitting: Advanced Practice Midwife

## 2014-03-24 ENCOUNTER — Encounter: Payer: Medicaid Other | Admitting: Advanced Practice Midwife

## 2014-03-25 NOTE — MAU Provider Note (Signed)
Attestation of Attending Supervision of Advanced Practitioner (CNM/NP): Evaluation and management procedures were performed by the Advanced Practitioner under my supervision and collaboration.  I have reviewed the Advanced Practitioner's note and chart, and I agree with the management and plan.  Shenia Alan Harraway-Smith 1:57 PM     

## 2014-03-27 ENCOUNTER — Encounter: Payer: Self-pay | Admitting: Advanced Practice Midwife

## 2014-03-27 ENCOUNTER — Ambulatory Visit (INDEPENDENT_AMBULATORY_CARE_PROVIDER_SITE_OTHER): Payer: Medicaid Other | Admitting: Advanced Practice Midwife

## 2014-03-27 VITALS — BP 113/73 | HR 91 | Temp 98.7°F | Wt 108.0 lb

## 2014-03-27 DIAGNOSIS — Z3480 Encounter for supervision of other normal pregnancy, unspecified trimester: Secondary | ICD-10-CM

## 2014-03-27 DIAGNOSIS — Z124 Encounter for screening for malignant neoplasm of cervix: Secondary | ICD-10-CM

## 2014-03-27 DIAGNOSIS — F17200 Nicotine dependence, unspecified, uncomplicated: Secondary | ICD-10-CM | POA: Insufficient documentation

## 2014-03-27 DIAGNOSIS — Z113 Encounter for screening for infections with a predominantly sexual mode of transmission: Secondary | ICD-10-CM

## 2014-03-27 LAB — POCT URINALYSIS DIPSTICK
Bilirubin, UA: NEGATIVE
GLUCOSE UA: NEGATIVE
KETONES UA: NEGATIVE
LEUKOCYTES UA: NEGATIVE
Nitrite, UA: NEGATIVE
Protein, UA: NEGATIVE
Spec Grav, UA: 1.01
Urobilinogen, UA: NEGATIVE
pH, UA: 5

## 2014-03-27 LAB — OB RESULTS CONSOLE GC/CHLAMYDIA
Chlamydia: NEGATIVE
Gonorrhea: NEGATIVE

## 2014-03-27 MED ORDER — OB COMPLETE PETITE 35-5-1-200 MG PO CAPS: 1.0000 | ORAL_CAPSULE | Freq: Every day | ORAL | Status: DC

## 2014-03-27 MED ORDER — VARENICLINE TARTRATE 1 MG PO TABS: 1.0000 mg | ORAL_TABLET | Freq: Two times a day (BID) | ORAL | Status: DC

## 2014-03-27 MED ORDER — VARENICLINE TARTRATE 0.5 MG X 11 & 1 MG X 42 PO MISC: ORAL | Status: DC

## 2014-03-27 NOTE — Progress Notes (Signed)
Subjective:    Mckenzie [name] is being seen today for her first obstetrical visit. She is at 5738w1d gestation. Her obstetrical history is significant for smoker. Relationship with FOB: significant other, living together. Patient uncertain intend to breast feed. Pregnancy history fully reviewed.  The information documented in the HPI was reviewed and verified.  Menstrual History: OB History   Grav Para Term Preterm Abortions TAB SAB Ect Mult Living   5 4 4       4       Menarche age: [age]  Patient's last menstrual period was 01/04/2014.    Past Medical History  Diagnosis Date  . Medical history non-contributory     Past Surgical History  Procedure Laterality Date  . No past surgeries       (Not in a hospital admission) No Known Allergies  History  Substance Use Topics  . Smoking status: Current Every Day Smoker -- 1.00 packs/day    Types: Cigarettes  . Smokeless tobacco: Never Used  . Alcohol Use: No     Comment: socially    History reviewed. No pertinent family history.   Review of Systems Constitutional: negative for weight loss Gastrointestinal: negative for vomiting Genitourinary:negative for genital lesions and vaginal discharge and dysuria Musculoskeletal:negative for back pain Behavioral/Psych: negative for abusive relationship, depression, illegal drug usage and tobacco use    Objective:     Filed Vitals:   03/27/14 0951  BP: 113/73  Pulse: 91  Temp: 98.7 F (37.1 C)   Filed Vitals:   03/27/14 0951  Weight: 108 lb (48.988 kg)    General Appearance:    Alert, cooperative, no distress, appears stated age  Head:    Normocephalic, without obvious abnormality, atraumatic  Eyes:    PERRL, conjunctiva/corneas clear, EOM's intact, fundi    benign, both eyes  Ears:    Normal TM's and external ear canals, both ears  Nose:   Nares normal, septum midline, mucosa normal, no drainage    or sinus tenderness  Throat:   Lips, mucosa, and tongue normal; teeth and  gums normal  Neck:   Supple, symmetrical, trachea midline, no adenopathy;    thyroid:  no enlargement/tenderness/nodules; no carotid   bruit or JVD  Back:     Symmetric, no curvature, ROM normal, no CVA tenderness  Lungs:     Clear to auscultation bilaterally, respirations unlabored  Chest Wall:    No tenderness or deformity   Heart:    Regular rate and rhythm, S1 and S2 normal, no murmur, rub   or gallop  Breast Exam:    No tenderness, masses, or nipple abnormality  Abdomen:     Soft, non-tender, bowel sounds active all four quadrants,    no masses, no organomegaly  Genitalia:    Normal female without lesion, discharge or tenderness  Extremities:   Extremities normal, atraumatic, no cyanosis or edema  Pulses:   2+ and symmetric all extremities  Skin:   Skin color, texture, turgor normal, no rashes or lesions  Lymph nodes:   Cervical, supraclavicular, and axillary nodes normal  Neurologic:   CNII-XII intact, normal strength, sensation and reflexes    throughout      Lab Review Urine pregnancy test Labs reviewed yes Radiologic studies reviewed yes Assessment:    Pregnancy at 5938w1d weeks  Patient Active Problem List   Diagnosis Date Noted  . Supervision of other normal pregnancy 03/27/2014  . Smoker 03/27/2014      Plan:  Prenatal vitamins.  Counseling provided regarding continued use of seat belts, cessation of alcohol consumption, smoking or use of illicit drugs; infection precautions i.e., influenza/TDAP immunizations, toxoplasmosis,CMV, parvovirus, listeria and varicella; workplace safety, exercise during pregnancy; routine dental care, safe medications, sexual activity, hot tubs, saunas, pools, travel, caffeine use, fish and methlymercury, potential toxins, hair treatments, varicose veins Weight gain recommendations per IOM guidelines reviewed: underweight/BMI< 18.5--> gain 28 - 40 lbs; normal weight/BMI 18.5 - 24.9--> gain 25 - 35 lbs; overweight/BMI 25 - 29.9--> gain  15 - 25 lbs; obese/BMI >30->gain  11 - 20 lbs Problem list reviewed and updated. FIRST/CF mutation testing/NIPT/QUAD SCREEN/fragile X/[name] population testing/Spinal muscular atrophy discussed: declined. Role of ultrasound in pregnancy discussed; fetal survey: requested. Amniocentesis discussed: not indicated.   Orders Placed This Encounter  Procedures  . Culture, OB Urine  . GC/Chlamydia Probe Amp  . HIV antibody  . Obstetric panel  . Vit D  25 hydroxy (rtn osteoporosis monitoring)  . Varicella zoster antibody, IgG  . Hemoglobinopathy evaluation  . TSH  . POCT urinalysis dipstick   Meds ordered this encounter  Medications  . varenicline (CHANTIX STARTING MONTH PAK) 0.5 MG X 11 & 1 MG X 42 tablet    Sig: Take one 0.5 mg tablet by mouth once daily for 3 days, then increase to one 0.5 mg tablet twice daily for 4 days, then increase to one 1 mg tablet twice daily.    Dispense:  53 tablet    Refill:  0    Order Specific Question:  Supervising Provider    Answer:  HARPER, CHARLES A [[id number]]  . varenicline (CHANTIX CONTINUING MONTH PAK) 1 MG tablet    Sig: Take 1 tablet (1 mg total) by mouth 2 (two) times daily.    Dispense:  60 tablet    Refill:  1    To be given once patient has completed Chantix starter pack    Order Specific Question:  Supervising Provider    Answer:  HARPER, CHARLES A [[id number]]  . Prenat-FeCbn-FeAspGl-FA-Omega (OB COMPLETE PETITE) 35-5-1-200 MG CAPS    Sig: Take 1 tablet by mouth daily.    Dispense:  30 capsule    Refill:  12    Order Specific Question:  Supervising Provider    Answer:  Coral CeoHARPER, CHARLES A [[id number]]   Chantix information reviewed, gave quit number, informed patient of Cat C drug status of Chantix.  .   Follow up in 4 weeks.  Cirilo Canner Wilson SingerWren CNM

## 2014-03-28 LAB — GC/CHLAMYDIA PROBE AMP
CT PROBE, AMP APTIMA: NEGATIVE
GC Probe RNA: NEGATIVE

## 2014-03-28 LAB — VITAMIN D 25 HYDROXY (VIT D DEFICIENCY, FRACTURES): VIT D 25 HYDROXY: 55 ng/mL (ref 30–89)

## 2014-03-28 LAB — VARICELLA ZOSTER ANTIBODY, IGG: Varicella IgG: 1495 Index — ABNORMAL HIGH (ref <135.00)

## 2014-03-28 LAB — OBSTETRIC PANEL
ANTIBODY SCREEN: NEGATIVE
Basophils Absolute: 0 10*3/uL (ref 0.0–0.1)
Basophils Relative: 0 % (ref 0–1)
Eosinophils Absolute: 0.1 10*3/uL (ref 0.0–0.7)
Eosinophils Relative: 1 % (ref 0–5)
HEMATOCRIT: 34.6 % — AB (ref 36.0–46.0)
HEMOGLOBIN: 12.1 g/dL (ref 12.0–15.0)
Hepatitis B Surface Ag: NEGATIVE
LYMPHS PCT: 29 % (ref 12–46)
Lymphs Abs: 1.6 10*3/uL (ref 0.7–4.0)
MCH: 32.8 pg (ref 26.0–34.0)
MCHC: 35 g/dL (ref 30.0–36.0)
MCV: 93.8 fL (ref 78.0–100.0)
MONO ABS: 0.5 10*3/uL (ref 0.1–1.0)
Monocytes Relative: 9 % (ref 3–12)
NEUTROS PCT: 61 % (ref 43–77)
Neutro Abs: 3.4 10*3/uL (ref 1.7–7.7)
Platelets: 195 10*3/uL (ref 150–400)
RBC: 3.69 MIL/uL — ABNORMAL LOW (ref 3.87–5.11)
RDW: 13.6 % (ref 11.5–15.5)
RUBELLA: 2.84 {index} — AB (ref <0.90)
Rh Type: POSITIVE
WBC: 5.5 10*3/uL (ref 4.0–10.5)

## 2014-03-28 LAB — HIV ANTIBODY (ROUTINE TESTING W REFLEX): HIV 1&2 Ab, 4th Generation: NONREACTIVE

## 2014-03-28 LAB — TSH: TSH: 0.503 u[IU]/mL (ref 0.350–4.500)

## 2014-03-29 LAB — CULTURE, OB URINE
Colony Count: NO GROWTH
Organism ID, Bacteria: NO GROWTH

## 2014-03-30 LAB — PAP IG W/ RFLX HPV ASCU

## 2014-03-31 LAB — HEMOGLOBINOPATHY EVALUATION
HGB A2 QUANT: 2.8 % (ref 2.2–3.2)
HGB A: 97.2 % (ref 96.8–97.8)
HGB F QUANT: 0 % (ref 0.0–2.0)
HGB S QUANTITAION: 0 %
Hemoglobin Other: 0 %

## 2014-04-21 ENCOUNTER — Other Ambulatory Visit: Payer: Self-pay | Admitting: *Deleted

## 2014-04-21 DIAGNOSIS — Z3480 Encounter for supervision of other normal pregnancy, unspecified trimester: Secondary | ICD-10-CM

## 2014-04-24 ENCOUNTER — Ambulatory Visit (INDEPENDENT_AMBULATORY_CARE_PROVIDER_SITE_OTHER): Payer: Medicaid Other | Admitting: Advanced Practice Midwife

## 2014-04-24 VITALS — BP 96/62 | HR 85 | Temp 97.8°F | Wt 110.0 lb

## 2014-04-24 DIAGNOSIS — Z3480 Encounter for supervision of other normal pregnancy, unspecified trimester: Secondary | ICD-10-CM

## 2014-04-24 DIAGNOSIS — R399 Unspecified symptoms and signs involving the genitourinary system: Secondary | ICD-10-CM

## 2014-04-24 DIAGNOSIS — R3989 Other symptoms and signs involving the genitourinary system: Secondary | ICD-10-CM

## 2014-04-24 NOTE — Progress Notes (Signed)
Subjective: Mckenzie [name] is a 32 y.o. at 17 weeks by 12 wk [place]  Patient denies vaginal leaking of fluid or bleeding, denies contractions.  Reports positive fetal movment.  Reports RLQ pain. States comes and goes. Denies N/V/D.   Started Chantix, 2 weeks ago. It has been very successful. Patient is almost quit smoking, just having a few cigarettes a day.  Objective: Filed Vitals:   04/24/14 0952  BP: 96/62  Pulse: 85  Temp: 97.8 F (36.6 C)   140 FHR Below U Fundal Height Fetal Position NA  Assessment: Patient Active Problem List   Diagnosis Date Noted  . Supervision of other normal pregnancy 03/27/2014  . Smoker 03/27/2014  Patient stopping smoking Round ligament pain in pregnancy.  Plan: Patient to return to clinic in 4 weeks ROS [place] scheduled beginning of June Congratulated patient on smoking status, cont to monitor. Reviewed warning signs in pregnancy. Patient to call with concerns PRN. Reviewed triage location. Schedule GCT NV. Discussed comfort measures for RL pain. Discussed warning signs.   Alisah Grandberry Wilson Singer CNM

## 2014-04-24 NOTE — Addendum Note (Signed)
Addended by: Marya Landry D on: 04/24/2014 11:56 AM   Modules accepted: Orders

## 2014-04-26 LAB — URINE CULTURE
Colony Count: NO GROWTH
Organism ID, Bacteria: NO GROWTH

## 2014-04-28 ENCOUNTER — Other Ambulatory Visit: Payer: Medicaid Other

## 2014-04-28 LAB — POCT URINALYSIS DIPSTICK
Bilirubin, UA: NEGATIVE
Blood, UA: 250
GLUCOSE UA: NEGATIVE
Ketones, UA: NEGATIVE
Leukocytes, UA: NEGATIVE
Nitrite, UA: NEGATIVE
PROTEIN UA: NEGATIVE
UROBILINOGEN UA: NEGATIVE
pH, UA: 7.5

## 2014-05-12 ENCOUNTER — Ambulatory Visit (INDEPENDENT_AMBULATORY_CARE_PROVIDER_SITE_OTHER): Payer: Medicaid Other

## 2014-05-12 ENCOUNTER — Other Ambulatory Visit: Payer: Self-pay | Admitting: *Deleted

## 2014-05-12 DIAGNOSIS — Z1389 Encounter for screening for other disorder: Secondary | ICD-10-CM

## 2014-05-12 LAB — US OB COMP + 14 WK

## 2014-05-26 ENCOUNTER — Encounter: Payer: Self-pay | Admitting: Advanced Practice Midwife

## 2014-05-26 ENCOUNTER — Ambulatory Visit (INDEPENDENT_AMBULATORY_CARE_PROVIDER_SITE_OTHER): Payer: Medicaid Other | Admitting: Advanced Practice Midwife

## 2014-05-26 VITALS — BP 101/63 | HR 86 | Temp 98.1°F | Wt 111.0 lb

## 2014-05-26 DIAGNOSIS — F17200 Nicotine dependence, unspecified, uncomplicated: Secondary | ICD-10-CM

## 2014-05-26 NOTE — Progress Notes (Signed)
Subjective: Mckenzie [name] is a 33 y.o. at 21 weeks by 12 wk US  Patient denies vaginal leaking of fluid or bleeding, denies contractions.  Reports positive fetal movment.  Decreased to smoking 1/2 PPD. Stopped Chantix  B/c it was making her tired. No concerns today.  Objective: Filed Vitals:   05/26/14 0946  BP: 101/63  Pulse: 86  Temp: 98.1 F (36.7 C)   140 FHR 21 Fundal Height Fetal Position unknown  Assessment: Patient Active Problem List   Diagnosis Date Noted  . Supervision of other normal pregnancy 03/27/2014  . Smoker 03/27/2014    Plan: Patient to return to clinic in 4 weeks Plan repeat US for growth NV secondary to smoking Reviewed US today Reviewed warning signs in pregnancy. Patient to call with concerns PRN. Reviewed triage location.  Amy Wilson SingerWren CNM

## 2014-06-10 ENCOUNTER — Telehealth: Payer: Self-pay | Admitting: *Deleted

## 2014-06-10 NOTE — Telephone Encounter (Signed)
Patient is requesting another Rx for Chantix. She said she had tried it before and wants to try it again.

## 2014-06-23 ENCOUNTER — Other Ambulatory Visit: Payer: Medicaid Other

## 2014-06-23 ENCOUNTER — Encounter: Payer: Medicaid Other | Admitting: Obstetrics

## 2014-06-23 ENCOUNTER — Encounter: Payer: Medicaid Other | Admitting: Advanced Practice Midwife

## 2014-07-07 ENCOUNTER — Ambulatory Visit (INDEPENDENT_AMBULATORY_CARE_PROVIDER_SITE_OTHER): Payer: Medicaid Other | Admitting: Obstetrics

## 2014-07-07 ENCOUNTER — Encounter: Payer: Self-pay | Admitting: Obstetrics

## 2014-07-07 ENCOUNTER — Other Ambulatory Visit: Payer: Medicaid Other

## 2014-07-07 VITALS — BP 96/62 | HR 92 | Temp 96.9°F | Wt 117.0 lb

## 2014-07-07 DIAGNOSIS — Z3480 Encounter for supervision of other normal pregnancy, unspecified trimester: Secondary | ICD-10-CM

## 2014-07-07 DIAGNOSIS — F17200 Nicotine dependence, unspecified, uncomplicated: Secondary | ICD-10-CM

## 2014-07-07 DIAGNOSIS — Z3482 Encounter for supervision of other normal pregnancy, second trimester: Secondary | ICD-10-CM

## 2014-07-07 DIAGNOSIS — K219 Gastro-esophageal reflux disease without esophagitis: Secondary | ICD-10-CM

## 2014-07-07 MED ORDER — VARENICLINE TARTRATE 0.5 MG X 11 & 1 MG X 42 PO MISC
ORAL | Status: DC
Start: 2014-07-07 — End: 2014-08-13

## 2014-07-07 MED ORDER — VARENICLINE TARTRATE 1 MG PO TABS: 1.0000 mg | ORAL_TABLET | Freq: Two times a day (BID) | ORAL | Status: DC

## 2014-07-07 MED ORDER — OMEPRAZOLE 20 MG PO CPDR: 20.0000 mg | DELAYED_RELEASE_CAPSULE | Freq: Two times a day (BID) | ORAL | Status: DC

## 2014-07-07 MED ORDER — VARENICLINE TARTRATE 0.5 MG X 11 & 1 MG X 42 PO MISC
ORAL | Status: DC
Start: 2014-07-07 — End: 2014-07-07

## 2014-07-07 NOTE — Progress Notes (Signed)
Subjective:     Mckenzie [name] is a 33 y.o. female being seen today for her obstetrical visit. She is at 780w5d gestation. Patient reports: Heartburn . Fetal movement: normal.  Problem List Items Addressed This Visit   Smoker   Relevant Medications      varenicline (CHANTIX STARTING MONTH PAK) 0.5 MG X 11 & 1 MG X 42 tablet      varenicline (CHANTIX) tablet   Supervision of other normal pregnancy - Primary   Relevant Orders      POCT urinalysis dipstick      CBC      HIV antibody      RPR      Glucose tolerance, 1 hour    Other Visit Diagnoses   GERD without esophagitis        Relevant Medications       omeprazole (PRILOSEC) capsule      Patient Active Problem List   Diagnosis Date Noted  . Supervision of other normal pregnancy 03/27/2014  . Smoker 03/27/2014   Objective:    BP 96/62  Pulse 92  Temp(Src) 96.9 F (36.1 C)  Wt 117 lb (53.071 kg)  LMP 01/04/2014 FHT: 140 BPM  Uterine Size: size equals dates     Assessment:    Pregnancy @ 3680w5d    Plan:    OBGCT: discussed. Signs and symptoms of preterm labor: discussed.  Labs, problem list reviewed and updated 2 hr GTT planned Follow up in 2 weeks.

## 2014-07-08 LAB — CBC
HCT: 33.3 % — ABNORMAL LOW (ref 36.0–46.0)
Hemoglobin: 11.3 g/dL — ABNORMAL LOW (ref 12.0–15.0)
MCH: 32.8 pg (ref 26.0–34.0)
MCHC: 33.9 g/dL (ref 30.0–36.0)
MCV: 96.5 fL (ref 78.0–100.0)
PLATELETS: 181 10*3/uL (ref 150–400)
RBC: 3.45 MIL/uL — AB (ref 3.87–5.11)
RDW: 13.8 % (ref 11.5–15.5)
WBC: 7.7 10*3/uL (ref 4.0–10.5)

## 2014-07-08 LAB — RPR

## 2014-07-08 LAB — HIV ANTIBODY (ROUTINE TESTING W REFLEX): HIV 1&2 Ab, 4th Generation: NONREACTIVE

## 2014-07-09 LAB — GLUCOSE TOLERANCE, 1 HOUR (50G) W/O FASTING: Glucose, 1 Hour GTT: 96 mg/dL (ref 70–140)

## 2014-07-21 ENCOUNTER — Encounter: Payer: Self-pay | Admitting: Obstetrics

## 2014-07-21 ENCOUNTER — Ambulatory Visit (INDEPENDENT_AMBULATORY_CARE_PROVIDER_SITE_OTHER): Payer: Medicaid Other | Admitting: Obstetrics

## 2014-07-21 VITALS — BP 96/54 | Temp 98.3°F | Wt 117.0 lb

## 2014-07-21 DIAGNOSIS — Z3480 Encounter for supervision of other normal pregnancy, unspecified trimester: Secondary | ICD-10-CM

## 2014-07-21 DIAGNOSIS — Z3483 Encounter for supervision of other normal pregnancy, third trimester: Secondary | ICD-10-CM

## 2014-07-21 NOTE — Progress Notes (Signed)
Subjective:    Mckenzie [name] is a 33 y.o. female being seen today for her obstetrical visit. She is at 3569w5d gestation. Patient reports no complaints. Fetal movement: normal.  Problem List Items Addressed This Visit   Supervision of other normal pregnancy - Primary   Relevant Orders      POCT urinalysis dipstick     Patient Active Problem List   Diagnosis Date Noted  . Supervision of other normal pregnancy 03/27/2014  . Smoker 03/27/2014   Objective:    BP 96/54  Temp(Src) 98.3 F (36.8 C)  Wt 117 lb (53.071 kg)  LMP 01/04/2014 FHT:  140 BPM  Uterine Size: size equals dates  Presentation: unsure     Assessment:    Pregnancy @ 9069w5d weeks   Plan:     labs reviewed, problem list updated Consent signed. GBS sent TDAP offered  Rhogam given for RH negative Pediatrician: discussed. Infant feeding: plans to breastfeed. Maternity leave: not discussed. Cigarette smoking: smokes 1 PPD. Orders Placed This Encounter  Procedures  . POCT urinalysis dipstick   No orders of the defined types were placed in this encounter.   Follow up in 2 Weeks.

## 2014-08-04 ENCOUNTER — Encounter: Payer: Medicaid Other | Admitting: Obstetrics

## 2014-08-11 ENCOUNTER — Encounter: Payer: Medicaid Other | Admitting: Obstetrics

## 2014-08-13 ENCOUNTER — Encounter (HOSPITAL_COMMUNITY): Payer: Self-pay | Admitting: *Deleted

## 2014-08-13 ENCOUNTER — Inpatient Hospital Stay (HOSPITAL_COMMUNITY)
Admission: AD | Admit: 2014-08-13 | Discharge: 2014-08-13 | Disposition: A | Discharge status: Home / Self Care | Payer: Medicaid Other | Source: Ambulatory Visit | Attending: Obstetrics | Admitting: Obstetrics

## 2014-08-13 DIAGNOSIS — R109 Unspecified abdominal pain: Secondary | ICD-10-CM | POA: Diagnosis present

## 2014-08-13 DIAGNOSIS — O99891 Other specified diseases and conditions complicating pregnancy: Secondary | ICD-10-CM | POA: Diagnosis not present

## 2014-08-13 DIAGNOSIS — O26899 Other specified pregnancy related conditions, unspecified trimester: Secondary | ICD-10-CM

## 2014-08-13 DIAGNOSIS — F17200 Nicotine dependence, unspecified, uncomplicated: Secondary | ICD-10-CM

## 2014-08-13 DIAGNOSIS — O9989 Other specified diseases and conditions complicating pregnancy, childbirth and the puerperium: Principal | ICD-10-CM

## 2014-08-13 DIAGNOSIS — O99330 Smoking (tobacco) complicating pregnancy, unspecified trimester: Secondary | ICD-10-CM | POA: Insufficient documentation

## 2014-08-13 LAB — URINALYSIS, ROUTINE W REFLEX MICROSCOPIC
BILIRUBIN URINE: NEGATIVE
Glucose, UA: NEGATIVE mg/dL
KETONES UR: NEGATIVE mg/dL
LEUKOCYTES UA: NEGATIVE
NITRITE: NEGATIVE
Protein, ur: NEGATIVE mg/dL
Specific Gravity, Urine: <1.005 — ABNORMAL LOW (ref 1.005–1.030)
UROBILINOGEN UA: 0.2 mg/dL (ref 0.0–1.0)
pH: 6.5 (ref 5.0–8.0)

## 2014-08-13 LAB — URINE MICROSCOPIC-ADD ON

## 2014-08-13 NOTE — MAU Note (Signed)
Having pain in abd for 2 wks, just " trying to tough it out".  Past 2 days has gotten worse, hurts to walk, feeling pressure.  No hx of PTL

## 2014-08-13 NOTE — MAU Note (Signed)
Patient state sshe has been having abdominal pain off and on for about 2 weeks. Denies bleeding or leaking. Reports fetal movement but less than usual.

## 2014-08-13 NOTE — MAU Provider Note (Signed)
History     CSN: [id number]  Arrival date and time: 08/13/14 1201   First Provider Initiated Contact with Patient 08/13/14 1308      Chief Complaint  Patient presents with  . Abdominal Pain   Abdominal Pain Pertinent negatives include no constipation, diarrhea, fever, nausea or vomiting.    Pt is a 33 yo G5P4004 at [redacted]w[redacted]d wks IUP here with report of abdominal pain that started 07/29/14.  Pain is intermittent in nature.  Pain is described as cramping and sharp.  Denies vaginal bleeding or leaking of fluid.  Pain increases with walking.  +pressure.  No history of preterm labor and no problems with current pregnancy.    Past Medical History  Diagnosis Date  . Medical history non-contributory     Past Surgical History  Procedure Laterality Date  . No past surgeries      No family history on file.  History  Substance Use Topics  . Smoking status: Current Every Day Smoker -- 1.00 packs/day    Types: Cigarettes  . Smokeless tobacco: Never Used  . Alcohol Use: No     Comment: socially    Allergies: No Known Allergies  Prescriptions prior to admission  Medication Sig Dispense Refill  . omeprazole (PRILOSEC) 20 MG capsule Take 1 capsule (20 mg total) by mouth 2 (two) times daily before a meal.  60 capsule  5  . Prenat-FeCbn-FeAspGl-FA-Omega (OB COMPLETE PETITE) 35-5-1-200 MG CAPS Take 1 tablet by mouth daily.  30 capsule  12  . varenicline (CHANTIX CONTINUING MONTH PAK) 1 MG tablet Take 1 tablet (1 mg total) by mouth 2 (two) times daily.  60 tablet  1  . varenicline (CHANTIX STARTING MONTH PAK) 0.5 MG X 11 & 1 MG X 42 tablet Take one 0.5 mg tablet by mouth once daily for 3 days, then increase to one 0.5 mg tablet twice daily for 4 days, then increase to one 1 mg tablet twice daily.  53 tablet  0    Review of Systems  Constitutional: Negative for fever and chills.  Gastrointestinal: Positive for abdominal pain. Negative for nausea, vomiting, diarrhea and constipation.   Genitourinary: Negative.   All other systems reviewed and are negative.  Physical Exam   Blood pressure 93/50, pulse 78, temperature 98.4 F (36.9 C), temperature source Oral, resp. rate 16, height 5' 5.5" (1.664 m), weight 53.524 kg (118 lb), last menstrual period 01/04/2014, SpO2 100.00%.  Physical Exam  Constitutional: She is oriented to person, place, and time. She appears well-developed and well-nourished. No distress.  HENT:  Head: Normocephalic.  Neck: Normal range of motion. Neck supple.  Cardiovascular: Normal rate, regular rhythm and normal heart sounds.   Respiratory: Effort normal and breath sounds normal.  GI: Soft. There is no tenderness.  Genitourinary: No bleeding around the vagina. Vaginal discharge (mucusy ) found.  Musculoskeletal: Normal range of motion. She exhibits no edema.  Neurological: She is alert and oriented to person, place, and time.  Skin: Skin is warm and dry.   Dilation: Closed Effacement (%): Thick Cervical Position: Posterior  MAU Course  Procedures  Results for orders placed during the hospital encounter of 08/13/14 (from the past 24 hour(s))  URINALYSIS, ROUTINE W REFLEX MICROSCOPIC     Status: Abnormal   Collection Time    08/13/14 12:14 PM      Result Value Ref Range   Color, Urine YELLOW  YELLOW   APPearance HAZY (*) CLEAR   Specific Gravity, Urine <1.005 (*) 1.005 -  1.030   pH 6.5  5.0 - 8.0   Glucose, UA NEGATIVE  NEGATIVE mg/dL   Hgb urine dipstick SMALL (*) NEGATIVE   Bilirubin Urine NEGATIVE  NEGATIVE   Ketones, ur NEGATIVE  NEGATIVE mg/dL   Protein, ur NEGATIVE  NEGATIVE mg/dL   Urobilinogen, UA 0.2  0.0 - 1.0 mg/dL   Nitrite NEGATIVE  NEGATIVE   Leukocytes, UA NEGATIVE  NEGATIVE  URINE MICROSCOPIC-ADD ON     Status: Abnormal   Collection Time    08/13/14 12:14 PM      Result Value Ref Range   Squamous Epithelial / LPF MANY (*) RARE   WBC, UA 0-2  <3 WBC/hpf   Bacteria, UA RARE  RARE   FHR 120's, +accels Toco -  2-6, then irritability  Consulted with Dr. Clearance Coots > reviewed HPI/exam/OB history > discharge to home, will see in office tomorrow Assessment and Plan  33 yo G5P4004 at [redacted]w[redacted]d wks IUP Abdominal Pain in Pregnancy - Normal Exam Category I FHR Tracing  Plan: Discharge to home Provided reassurance Keep scheduled appointment for tomorrow Reviewed warning signs of pregnancy  Mckenzie [name] 08/13/2014, 1:09 PM

## 2014-08-13 NOTE — Discharge Instructions (Signed)
Abdominal Pain During Pregnancy °Belly (abdominal) pain is common during pregnancy. Most of the time, it is not a serious problem. Other times, it can be a sign that something is wrong with the pregnancy. Always tell your doctor if you have belly pain. °HOME CARE °Monitor your belly pain for any changes. The following actions may help you feel better: °· Do not have sex (intercourse) or put anything in your vagina until you feel better. °· Rest until your pain stops. °· Drink clear fluids if you feel sick to your stomach (nauseous). Do not eat solid food until you feel better. °· Only take medicine as told by your doctor. °· Keep all doctor visits as told. °GET HELP RIGHT AWAY IF:  °· You are bleeding, leaking fluid, or pieces of tissue come out of your vagina. °· You have more pain or cramping. °· You keep throwing up (vomiting). °· You have pain when you pee (urinate) or have blood in your pee. °· You have a fever. °· You do not feel your baby moving as much. °· You feel very weak or feel like passing out. °· You have trouble breathing, with or without belly pain. °· You have a very bad headache and belly pain. °· You have fluid leaking from your vagina and belly pain. °· You keep having watery poop (diarrhea). °· Your belly pain does not go away after resting, or the pain gets worse. °MAKE SURE YOU:  °· Understand these instructions. °· Will watch your condition. °· Will get help right away if you are not doing well or get worse. °Document Released: 11/08/2009 Document Revised: 07/23/2013 Document Reviewed: 06/19/2013 °[employer]® Patient Information ©2015 [employer]. This information is not intended to replace advice given to you by your health care provider. Make sure you discuss any questions you have with your health care provider. ° °

## 2014-08-14 ENCOUNTER — Encounter: Payer: Medicaid Other | Admitting: Obstetrics

## 2014-08-24 ENCOUNTER — Ambulatory Visit (INDEPENDENT_AMBULATORY_CARE_PROVIDER_SITE_OTHER): Payer: Medicaid Other | Admitting: Obstetrics

## 2014-08-24 ENCOUNTER — Encounter: Payer: Self-pay | Admitting: Obstetrics

## 2014-08-24 VITALS — BP 89/58 | HR 78 | Temp 98.8°F | Wt 119.0 lb

## 2014-08-24 DIAGNOSIS — Z3483 Encounter for supervision of other normal pregnancy, third trimester: Secondary | ICD-10-CM

## 2014-08-24 DIAGNOSIS — O365939 Maternal care for other known or suspected poor fetal growth, third trimester, other fetus: Secondary | ICD-10-CM

## 2014-08-24 DIAGNOSIS — Z3480 Encounter for supervision of other normal pregnancy, unspecified trimester: Secondary | ICD-10-CM

## 2014-08-24 DIAGNOSIS — O365990 Maternal care for other known or suspected poor fetal growth, unspecified trimester, not applicable or unspecified: Secondary | ICD-10-CM

## 2014-08-24 LAB — POCT URINALYSIS DIPSTICK
BILIRUBIN UA: NEGATIVE
Glucose, UA: NEGATIVE
Ketones, UA: NEGATIVE
Leukocytes, UA: NEGATIVE
Nitrite, UA: NEGATIVE
PH UA: 5
Protein, UA: NEGATIVE
Urobilinogen, UA: NEGATIVE

## 2014-08-24 NOTE — Progress Notes (Signed)
Subjective:    Mckenzie [name] is a 33 y.o. female being seen today for her obstetrical visit. She is at [redacted]w[redacted]d gestation. Patient reports no complaints. Fetal movement: normal.  Problem List Items Addressed This Visit   Supervision of other normal pregnancy   Relevant Orders      POCT urinalysis dipstick (Completed)    Other Visit Diagnoses   Poor fetal growth, affecting management of mother, antepartum condition or complication, third trimester, other fetus    -  Primary    Relevant Orders       US OB Follow Up       US OB Comp + 14 Wk      Patient Active Problem List   Diagnosis Date Noted  . Supervision of other normal pregnancy 03/27/2014  . Smoker 03/27/2014   Objective:    BP 89/58  Pulse 78  Temp(Src) 98.8 F (37.1 C)  Wt 119 lb (53.978 kg)  LMP 01/04/2014 FHT:  140 BPM  Uterine Size: size less than dates  Presentation: unsure     Assessment:    Pregnancy @ [redacted]w[redacted]d weeks   Size < Dates  Plan:   Ultrasound ordered for growth.    labs reviewed, problem list updated Consent signed. GBS sent TDAP offered  Rhogam given for RH negative Pediatrician: discussed. Infant feeding: plans to breastfeed. Maternity leave: discussed. Cigarette smoking: smokes 1 PPD. Orders Placed This Encounter  Procedures  . US OB Follow Up    Standing Status: Future     Number of Occurrences:      Standing Expiration Date: 10/25/2015    Order Specific Question:  Reason for Exam (SYMPTOM  OR DIAGNOSIS REQUIRED)    Answer:  size<dates    Order Specific Question:  Preferred imaging location?    Answer:  [facility]  . US OB Comp + 14 Wk    Standing Status: Future     Number of Occurrences:      Standing Expiration Date: 10/25/2015    Order Specific Question:  Reason for Exam (SYMPTOM  OR DIAGNOSIS REQUIRED)    Answer:  size<dates    Order Specific Question:  Preferred imaging location?    Answer:  [facility]  . POCT urinalysis dipstick   No orders of the defined  types were placed in this encounter.   Follow up in 1 Week.

## 2014-08-28 ENCOUNTER — Ambulatory Visit (HOSPITAL_COMMUNITY)
Admission: RE | Admit: 2014-08-28 | Discharge: 2014-08-28 | Disposition: A | Discharge status: Home / Self Care | Payer: Medicaid Other | Source: Ambulatory Visit | Attending: Obstetrics | Admitting: Obstetrics

## 2014-08-28 DIAGNOSIS — Z1389 Encounter for screening for other disorder: Secondary | ICD-10-CM | POA: Diagnosis not present

## 2014-08-28 DIAGNOSIS — Z363 Encounter for antenatal screening for malformations: Secondary | ICD-10-CM | POA: Insufficient documentation

## 2014-08-28 DIAGNOSIS — O365990 Maternal care for other known or suspected poor fetal growth, unspecified trimester, not applicable or unspecified: Secondary | ICD-10-CM | POA: Insufficient documentation

## 2014-08-28 DIAGNOSIS — O365939 Maternal care for other known or suspected poor fetal growth, third trimester, other fetus: Secondary | ICD-10-CM

## 2014-09-02 ENCOUNTER — Ambulatory Visit (INDEPENDENT_AMBULATORY_CARE_PROVIDER_SITE_OTHER): Payer: Medicaid Other | Admitting: Obstetrics

## 2014-09-02 ENCOUNTER — Encounter: Payer: Self-pay | Admitting: Obstetrics

## 2014-09-02 VITALS — BP 99/62 | HR 69 | Temp 98.6°F | Wt 123.0 lb

## 2014-09-02 DIAGNOSIS — Z3483 Encounter for supervision of other normal pregnancy, third trimester: Secondary | ICD-10-CM

## 2014-09-02 DIAGNOSIS — Z3480 Encounter for supervision of other normal pregnancy, unspecified trimester: Secondary | ICD-10-CM

## 2014-09-02 NOTE — Progress Notes (Signed)
Patient reports she had some braxton hicks contraction Sunday evening- but nothing that signifies labor to her.

## 2014-09-02 NOTE — Progress Notes (Signed)
Subjective:    Mckenzie [name] is a 33 y.o. female being seen today for her obstetrical visit. She is at 3020w6d gestation. Patient reports backache. Fetal movement: normal.  Problem List Items Addressed This Visit   Supervision of other normal pregnancy - Primary   Relevant Orders      POCT urinalysis dipstick      Strep B DNA probe     Patient Active Problem List   Diagnosis Date Noted  . Supervision of other normal pregnancy 03/27/2014  . Smoker 03/27/2014   Objective:    BP 99/62  Pulse 69  Temp(Src) 98.6 F (37 C)  Wt 123 lb (55.792 kg)  LMP 01/04/2014 FHT:  140 BPM  Uterine Size: size equals dates  Presentation: unsure     Assessment:    Pregnancy @ 3020w6d weeks   Backache.  Plan:    Maternity Belt ordered.    labs reviewed, problem list updated Consent signed. GBS sent TDAP offered  Rhogam given for RH negative Pediatrician: discussed. Infant feeding: plans to breastfeed. Maternity leave: not discussed. Cigarette smoking: smokes 1 PPD. Orders Placed This Encounter  Procedures  . Strep B DNA probe  . POCT urinalysis dipstick   No orders of the defined types were placed in this encounter.   Follow up in 1 Week.

## 2014-09-03 LAB — STREP B DNA PROBE: GBSP: DETECTED

## 2014-09-09 ENCOUNTER — Ambulatory Visit (INDEPENDENT_AMBULATORY_CARE_PROVIDER_SITE_OTHER): Payer: Medicaid Other | Admitting: Obstetrics

## 2014-09-09 ENCOUNTER — Encounter: Payer: Self-pay | Admitting: Obstetrics

## 2014-09-09 VITALS — BP 99/59 | HR 80 | Temp 98.0°F | Wt 125.0 lb

## 2014-09-09 DIAGNOSIS — Z3483 Encounter for supervision of other normal pregnancy, third trimester: Secondary | ICD-10-CM

## 2014-09-09 NOTE — Progress Notes (Signed)
Subjective:    Mckenzie [name] is a 33 y.o. female being seen today for her obstetrical visit. She is at 3537w6d gestation. Patient reports no complaints. Fetal movement: normal.  Problem List Items Addressed This Visit   Supervision of other normal pregnancy - Primary   Relevant Orders      POCT urinalysis dipstick     Patient Active Problem List   Diagnosis Date Noted  . Supervision of other normal pregnancy 03/27/2014  . Smoker 03/27/2014   Objective:    BP 99/59  Pulse 80  Temp(Src) 98 F (36.7 C)  Wt 125 lb (56.7 kg)  LMP 01/04/2014 FHT:  150 BPM  Uterine Size: size equals dates  Presentation: unsure     Assessment:    Pregnancy @ 7037w6d weeks   Plan:     labs reviewed, problem list updated Consent signed. GBS sent TDAP offered  Rhogam given for RH negative Pediatrician: discussed. Infant feeding: plans to breastfeed. Maternity leave: discussed. Cigarette smoking: 1 PPD Orders Placed This Encounter  Procedures  . POCT urinalysis dipstick   No orders of the defined types were placed in this encounter.   Follow up in 1 Week.

## 2014-09-10 ENCOUNTER — Encounter (HOSPITAL_COMMUNITY): Payer: Self-pay | Admitting: *Deleted

## 2014-09-10 ENCOUNTER — Inpatient Hospital Stay (HOSPITAL_COMMUNITY)
Admission: AD | Admit: 2014-09-10 | Discharge: 2014-09-13 | DRG: 775 | Disposition: A | Discharge status: Home / Self Care | Payer: Medicaid Other | Source: Ambulatory Visit | Attending: Obstetrics | Admitting: Obstetrics

## 2014-09-10 DIAGNOSIS — IMO0002 Reserved for concepts with insufficient information to code with codable children: Secondary | ICD-10-CM | POA: Clinically undetermined

## 2014-09-10 DIAGNOSIS — F17210 Nicotine dependence, cigarettes, uncomplicated: Secondary | ICD-10-CM | POA: Diagnosis present

## 2014-09-10 DIAGNOSIS — Z3A37 37 weeks gestation of pregnancy: Secondary | ICD-10-CM | POA: Diagnosis present

## 2014-09-10 DIAGNOSIS — O0943 Supervision of pregnancy with grand multiparity, third trimester: Secondary | ICD-10-CM

## 2014-09-10 DIAGNOSIS — F17200 Nicotine dependence, unspecified, uncomplicated: Secondary | ICD-10-CM

## 2014-09-10 DIAGNOSIS — O99334 Smoking (tobacco) complicating childbirth: Secondary | ICD-10-CM | POA: Diagnosis present

## 2014-09-10 DIAGNOSIS — R001 Bradycardia, unspecified: Secondary | ICD-10-CM

## 2014-09-10 DIAGNOSIS — O99824 Streptococcus B carrier state complicating childbirth: Secondary | ICD-10-CM | POA: Diagnosis present

## 2014-09-10 MED ORDER — TERBUTALINE SULFATE 1 MG/ML IJ SOLN
0.2500 mg | Freq: Once | INTRAMUSCULAR | Status: AC
  Administered 2014-09-10: 0.25 mg via SUBCUTANEOUS

## 2014-09-10 MED ORDER — TERBUTALINE SULFATE 1 MG/ML IJ SOLN
INTRAMUSCULAR | Status: AC
  Filled 2014-09-10: qty 1

## 2014-09-10 MED ORDER — OXYCODONE-ACETAMINOPHEN 5-325 MG PO TABS
1.0000 | ORAL_TABLET | Freq: Once | ORAL | Status: AC
Start: 2014-09-10 — End: 2014-09-10
  Administered 2014-09-10: 1 via ORAL
  Filled 2014-09-10: qty 1

## 2014-09-10 NOTE — MAU Note (Signed)
Contractions. Denies vaginal bleeding or LOF. Positive fetal movement. Hx of 4 vaginal deliveries.

## 2014-09-10 NOTE — MAU Note (Signed)
PT  SAYS SHE HAS BEEN HURT BAD SINCE 7PM.  NO VE IN OFFICE.   DENIES HSV AND MRSA.   GBS-  NEG

## 2014-09-11 ENCOUNTER — Encounter (HOSPITAL_COMMUNITY): Payer: Self-pay | Admitting: *Deleted

## 2014-09-11 ENCOUNTER — Ambulatory Visit (HOSPITAL_COMMUNITY): Payer: Medicaid Other

## 2014-09-11 ENCOUNTER — Inpatient Hospital Stay (HOSPITAL_COMMUNITY): Payer: Medicaid Other

## 2014-09-11 ENCOUNTER — Inpatient Hospital Stay (HOSPITAL_COMMUNITY): Payer: Medicaid Other | Admitting: Anesthesiology

## 2014-09-11 ENCOUNTER — Encounter (HOSPITAL_COMMUNITY): Payer: Medicaid Other | Admitting: Anesthesiology

## 2014-09-11 DIAGNOSIS — O99334 Smoking (tobacco) complicating childbirth: Secondary | ICD-10-CM | POA: Diagnosis present

## 2014-09-11 DIAGNOSIS — Z3A37 37 weeks gestation of pregnancy: Secondary | ICD-10-CM | POA: Diagnosis present

## 2014-09-11 DIAGNOSIS — O0943 Supervision of pregnancy with grand multiparity, third trimester: Secondary | ICD-10-CM | POA: Diagnosis not present

## 2014-09-11 DIAGNOSIS — O9989 Other specified diseases and conditions complicating pregnancy, childbirth and the puerperium: Secondary | ICD-10-CM | POA: Diagnosis present

## 2014-09-11 DIAGNOSIS — IMO0002 Reserved for concepts with insufficient information to code with codable children: Secondary | ICD-10-CM | POA: Clinically undetermined

## 2014-09-11 DIAGNOSIS — F17210 Nicotine dependence, cigarettes, uncomplicated: Secondary | ICD-10-CM | POA: Diagnosis present

## 2014-09-11 DIAGNOSIS — O99824 Streptococcus B carrier state complicating childbirth: Secondary | ICD-10-CM | POA: Diagnosis present

## 2014-09-11 LAB — ABO/RH: ABO/RH(D): A POS

## 2014-09-11 LAB — HIV ANTIBODY (ROUTINE TESTING W REFLEX): HIV: NONREACTIVE

## 2014-09-11 LAB — RAPID URINE DRUG SCREEN, HOSP PERFORMED
Amphetamines: NOT DETECTED
BARBITURATES: NOT DETECTED
Benzodiazepines: NOT DETECTED
Cocaine: NOT DETECTED
OPIATES: NOT DETECTED
Tetrahydrocannabinol: NOT DETECTED

## 2014-09-11 LAB — CBC
HEMATOCRIT: 32.7 % — AB (ref 36.0–46.0)
HEMOGLOBIN: 11.3 g/dL — AB (ref 12.0–15.0)
MCH: 32 pg (ref 26.0–34.0)
MCHC: 34.6 g/dL (ref 30.0–36.0)
MCV: 92.6 fL (ref 78.0–100.0)
Platelets: 163 10*3/uL (ref 150–400)
RBC: 3.53 MIL/uL — ABNORMAL LOW (ref 3.87–5.11)
RDW: 13.6 % (ref 11.5–15.5)
WBC: 7 10*3/uL (ref 4.0–10.5)

## 2014-09-11 LAB — TYPE AND SCREEN
ABO/RH(D): A POS
Antibody Screen: NEGATIVE

## 2014-09-11 LAB — RPR

## 2014-09-11 MED ORDER — OXYCODONE-ACETAMINOPHEN 5-325 MG PO TABS
1.0000 | ORAL_TABLET | ORAL | Status: DC | PRN
  Administered 2014-09-12: 1 via ORAL
  Filled 2014-09-11: qty 1

## 2014-09-11 MED ORDER — OXYCODONE-ACETAMINOPHEN 5-325 MG PO TABS: 2.0000 | ORAL_TABLET | ORAL | Status: DC | PRN

## 2014-09-11 MED ORDER — OXYTOCIN 40 UNITS IN LACTATED RINGERS INFUSION - SIMPLE MED
1.0000 m[IU]/min | INTRAVENOUS | Status: DC
  Administered 2014-09-11: 1 m[IU]/min via INTRAVENOUS

## 2014-09-11 MED ORDER — DIPHENHYDRAMINE HCL 50 MG/ML IJ SOLN: 12.5000 mg | INTRAMUSCULAR | Status: DC | PRN

## 2014-09-11 MED ORDER — BUTORPHANOL TARTRATE 1 MG/ML IJ SOLN: 1.0000 mg | INTRAMUSCULAR | Status: DC | PRN

## 2014-09-11 MED ORDER — ZOLPIDEM TARTRATE 5 MG PO TABS: 5.0000 mg | ORAL_TABLET | Freq: Every evening | ORAL | Status: DC | PRN

## 2014-09-11 MED ORDER — LACTATED RINGERS IV SOLN: 500.0000 mL | INTRAVENOUS | Status: DC | PRN

## 2014-09-11 MED ORDER — OXYTOCIN 40 UNITS IN LACTATED RINGERS INFUSION - SIMPLE MED
62.5000 mL/h | INTRAVENOUS | Status: DC
  Filled 2014-09-11: qty 1000

## 2014-09-11 MED ORDER — CITRIC ACID-SODIUM CITRATE 334-500 MG/5ML PO SOLN: 30.0000 mL | ORAL | Status: DC | PRN

## 2014-09-11 MED ORDER — LACTATED RINGERS IV SOLN
INTRAVENOUS | Status: DC
  Administered 2014-09-11: 05:00:00 via INTRAVENOUS

## 2014-09-11 MED ORDER — SENNOSIDES-DOCUSATE SODIUM 8.6-50 MG PO TABS
2.0000 | ORAL_TABLET | ORAL | Status: DC
  Administered 2014-09-12 (×2): 2 via ORAL
  Filled 2014-09-11 (×2): qty 2

## 2014-09-11 MED ORDER — PRENATAL MULTIVITAMIN CH
1.0000 | ORAL_TABLET | Freq: Every day | ORAL | Status: DC
  Administered 2014-09-12 – 2014-09-13 (×2): 1 via ORAL
  Filled 2014-09-11 (×2): qty 1

## 2014-09-11 MED ORDER — LIDOCAINE HCL (PF) 1 % IJ SOLN
INTRAMUSCULAR | Status: DC | PRN
  Administered 2014-09-11 (×4): 4 mL

## 2014-09-11 MED ORDER — DEXTROSE 5 % IV SOLN
5.0000 10*6.[IU] | Freq: Once | INTRAVENOUS | Status: AC
  Administered 2014-09-11: 5 10*6.[IU] via INTRAVENOUS
  Filled 2014-09-11: qty 5

## 2014-09-11 MED ORDER — INFLUENZA VAC SPLIT QUAD 0.5 ML IM SUSY
0.5000 mL | PREFILLED_SYRINGE | INTRAMUSCULAR | Status: AC
  Administered 2014-09-12: 0.5 mL via INTRAMUSCULAR
  Filled 2014-09-11: qty 0.5

## 2014-09-11 MED ORDER — ONDANSETRON HCL 4 MG PO TABS: 4.0000 mg | ORAL_TABLET | ORAL | Status: DC | PRN

## 2014-09-11 MED ORDER — IBUPROFEN 600 MG PO TABS
600.0000 mg | ORAL_TABLET | Freq: Four times a day (QID) | ORAL | Status: DC
  Administered 2014-09-11 – 2014-09-13 (×7): 600 mg via ORAL
  Filled 2014-09-11 (×7): qty 1

## 2014-09-11 MED ORDER — OXYCODONE-ACETAMINOPHEN 5-325 MG PO TABS: 1.0000 | ORAL_TABLET | ORAL | Status: DC | PRN

## 2014-09-11 MED ORDER — SIMETHICONE 80 MG PO CHEW: 80.0000 mg | CHEWABLE_TABLET | ORAL | Status: DC | PRN

## 2014-09-11 MED ORDER — FERROUS SULFATE 325 (65 FE) MG PO TABS
325.0000 mg | ORAL_TABLET | Freq: Two times a day (BID) | ORAL | Status: DC
  Administered 2014-09-12 – 2014-09-13 (×3): 325 mg via ORAL
  Filled 2014-09-11 (×3): qty 1

## 2014-09-11 MED ORDER — EPHEDRINE 5 MG/ML INJ
10.0000 mg | INTRAVENOUS | Status: DC | PRN
  Filled 2014-09-11: qty 2

## 2014-09-11 MED ORDER — ONDANSETRON HCL 4 MG/2ML IJ SOLN: 4.0000 mg | INTRAMUSCULAR | Status: DC | PRN

## 2014-09-11 MED ORDER — PHENYLEPHRINE 40 MCG/ML (10ML) SYRINGE FOR IV PUSH (FOR BLOOD PRESSURE SUPPORT)
80.0000 ug | PREFILLED_SYRINGE | INTRAVENOUS | Status: DC | PRN
  Administered 2014-09-11: 80 ug via INTRAVENOUS
  Filled 2014-09-11: qty 2

## 2014-09-11 MED ORDER — TETANUS-DIPHTH-ACELL PERTUSSIS 5-2.5-18.5 LF-MCG/0.5 IM SUSP
0.5000 mL | Freq: Once | INTRAMUSCULAR | Status: AC
  Administered 2014-09-12: 0.5 mL via INTRAMUSCULAR
  Filled 2014-09-11: qty 0.5

## 2014-09-11 MED ORDER — PHENYLEPHRINE 40 MCG/ML (10ML) SYRINGE FOR IV PUSH (FOR BLOOD PRESSURE SUPPORT)
80.0000 ug | PREFILLED_SYRINGE | INTRAVENOUS | Status: DC | PRN
  Filled 2014-09-11: qty 2
  Filled 2014-09-11: qty 10

## 2014-09-11 MED ORDER — OXYTOCIN BOLUS FROM INFUSION: 500.0000 mL | INTRAVENOUS | Status: DC

## 2014-09-11 MED ORDER — DIBUCAINE 1 % RE OINT: 1.0000 "application " | TOPICAL_OINTMENT | RECTAL | Status: DC | PRN

## 2014-09-11 MED ORDER — DIPHENHYDRAMINE HCL 25 MG PO CAPS: 25.0000 mg | ORAL_CAPSULE | Freq: Four times a day (QID) | ORAL | Status: DC | PRN

## 2014-09-11 MED ORDER — PENICILLIN G POTASSIUM 5000000 UNITS IJ SOLR
2.5000 10*6.[IU] | INTRAVENOUS | Status: DC
  Filled 2014-09-11 (×3): qty 2.5

## 2014-09-11 MED ORDER — FENTANYL 2.5 MCG/ML BUPIVACAINE 1/10 % EPIDURAL INFUSION (WH - ANES)
14.0000 mL/h | INTRAMUSCULAR | Status: DC | PRN
  Administered 2014-09-11: 14 mL/h via EPIDURAL
  Filled 2014-09-11: qty 125

## 2014-09-11 MED ORDER — WITCH HAZEL-GLYCERIN EX PADS: 1.0000 "application " | MEDICATED_PAD | CUTANEOUS | Status: DC | PRN

## 2014-09-11 MED ORDER — LIDOCAINE HCL (PF) 1 % IJ SOLN
30.0000 mL | INTRAMUSCULAR | Status: DC | PRN
  Filled 2014-09-11: qty 30

## 2014-09-11 MED ORDER — PNEUMOCOCCAL VAC POLYVALENT 25 MCG/0.5ML IJ INJ
0.5000 mL | INJECTION | INTRAMUSCULAR | Status: AC
  Administered 2014-09-13: 0.5 mL via INTRAMUSCULAR
  Filled 2014-09-11: qty 0.5

## 2014-09-11 MED ORDER — ZOLPIDEM TARTRATE 5 MG PO TABS: 10.0000 mg | ORAL_TABLET | Freq: Once | ORAL | Status: DC

## 2014-09-11 MED ORDER — LACTATED RINGERS IV SOLN
500.0000 mL | Freq: Once | INTRAVENOUS | Status: AC
  Administered 2014-09-11: 500 mL via INTRAVENOUS

## 2014-09-11 MED ORDER — LACTATED RINGERS IV SOLN
INTRAVENOUS | Status: DC
  Administered 2014-09-11: 13:00:00 via INTRAUTERINE

## 2014-09-11 MED ORDER — LANOLIN HYDROUS EX OINT: TOPICAL_OINTMENT | CUTANEOUS | Status: DC | PRN

## 2014-09-11 MED ORDER — TERBUTALINE SULFATE 1 MG/ML IJ SOLN: 0.2500 mg | Freq: Once | INTRAMUSCULAR | Status: DC | PRN

## 2014-09-11 MED ORDER — BENZOCAINE-MENTHOL 20-0.5 % EX AERO: 1.0000 "application " | INHALATION_SPRAY | CUTANEOUS | Status: DC | PRN

## 2014-09-11 MED ORDER — ONDANSETRON HCL 4 MG/2ML IJ SOLN: 4.0000 mg | Freq: Four times a day (QID) | INTRAMUSCULAR | Status: DC | PRN

## 2014-09-11 MED ORDER — ACETAMINOPHEN 325 MG PO TABS: 650.0000 mg | ORAL_TABLET | ORAL | Status: DC | PRN

## 2014-09-11 NOTE — Progress Notes (Signed)
Mckenzie Mckenzie [name] is Mckenzie [name] 33 y.o. G5P4004 at 7058w1d by LMP admitted for rule out labor  Subjective: Comfortable  Objective: BP 98/59  Pulse 67  Temp(Src) 98.5 F (36.9 C) (Oral)  Resp 18  Ht 5' 5.5" (1.664 m)  Wt 56.7 kg (125 lb)  BMI 20.48 kg/m2  LMP 01/04/2014      FHT:  FHR: 120 bpm, variability: moderate,  accelerations:  Present,  decelerations:  Present mild variable decelerations UC:   irregular, every 7 minutes SVE:   Dilation: 2 Effacement (%): 60 Station: -2;-3 Exam by:: hk AROM-->clear fluid, IUPC placed Labs: Lab Results  Component Value Date   WBC 7.0 09/10/2014   HGB 11.3* 09/10/2014   HCT 32.7* 09/10/2014   MCV 92.6 09/10/2014   PLT 163 09/10/2014    Assessment / Plan: Multipara @ 7158w1d.  Prodromal labor.  Fetal heart tracing with intermittent decelerations--some prolonged.  Long term variability/accelerations reassuring. MFM note appreciated.  GBS positive  Labor: see above; start Pitocin Preeclampsia:  n/Mckenzie [name] Fetal Wellbeing:  Category II Pain Control:  Labor support without medications I/D:  n/Mckenzie [name] Anticipated MOD:  NSVD PCN for GBS prophylaxis Mckenzie Mckenzie [name] 09/11/2014, 12:50 PM

## 2014-09-11 NOTE — Anesthesia Procedure Notes (Signed)
Epidural Patient location during procedure: OB Start time: 09/11/2014 3:41 PM  Staffing Anesthesiologist: Tomeca Helm Performed by: anesthesiologist   Preanesthetic Checklist Completed: patient identified, site marked, surgical consent, pre-op evaluation, timeout performed, IV checked, risks and benefits discussed and monitors and equipment checked  Epidural Patient position: sitting Prep: site prepped and draped and DuraPrep Patient monitoring: continuous pulse ox and blood pressure Approach: midline Location: L3-L4 Injection technique: LOR air  Needle:  Needle type: Tuohy  Needle gauge: 17 G Needle length: 9 cm and 9 Needle insertion depth: 4 cm Catheter type: closed end flexible Catheter size: 19 Gauge Catheter at skin depth: 8 cm Test dose: negative  Assessment Events: blood not aspirated, injection not painful, no injection resistance, negative IV test and no paresthesia  Additional Notes Discussed risk of headache, infection, bleeding, nerve injury and failed or incomplete block.  Patient voices understanding and wishes to proceed.  Epidural placed easily on first attempt.  No paresthesia. Patient tolerated procedure well with no apparent complications.  Jasmine DecemberA. Kilee Hedding, MDReason for block:procedure for pain

## 2014-09-11 NOTE — Progress Notes (Signed)
MFM  Asked by Dr. Tamela OddiJackson-Moore to review patient's chart and tracing for recommendations.  33 yr old G5P4004 at 1824w1d with intermittent decelerations.  I reviewed the fetal tracings from admission until now. There was a 10 minute deceleration last night. Fetus recovered with resuscitative measures. There were 2 appearing late decelerations and at 8:30am this morning there was another significant variable deceleration to the 60s. Based on the above and gestational age >37 weeks. I recommend delivery.  Fetal status is overall reassuring and I feel a contraction stress test can be done- if fetus passes I feel it is reasonable to have a trial of labor with continuous fetal monitoring and C section for nonreassuring fetal tracing.  Discussed with Dr. Tamela OddiJackson-Moore who is in agreement and will proceed.  Eulis FosterKristen Macarena Langseth, MD

## 2014-09-11 NOTE — H&P (Signed)
Mckenzie [name] is a 33 y.o. female presenting for UC's.  Patient evaluated for labor with UC's q 2 minutes and painful.  The contractions then became spontaneously hyperstimulated an the fetus had a prolonged bradycardia that responded well to sub Q terbutaline. Maternal Medical History:  Contractions: Onset was 6-12 hours ago.    Fetal activity: Perceived fetal activity is normal.   Last perceived fetal movement was within the past hour.    Prenatal complications: no prenatal complications Prenatal Complications - Diabetes: none.    OB History   Grav Para Term Preterm Abortions TAB SAB Ect Mult Living   5 4 4       4      Past Medical History  Diagnosis Date  . Medical history non-contributory    Past Surgical History  Procedure Laterality Date  . No past surgeries     Family History: family history is not on file. Social History:  reports that she has been smoking Cigarettes.  She has been smoking about 1.00 pack per day. She has never used smokeless tobacco. She reports that she does not drink alcohol or use illicit drugs.   Prenatal Transfer Tool  Maternal Diabetes: No Genetic Screening: Normal Maternal Ultrasounds/Referrals: Normal Fetal Ultrasounds or other Referrals:  None Maternal Substance Abuse:  No Significant Maternal Medications:  None Significant Maternal Lab Results:  None Other Comments:  None  Review of Systems  All other systems reviewed and are negative.   Dilation: 2.5 Effacement (%): 60 Station: -2 Exam by:: Judeth HornErin Lawrence RNC Blood pressure 110/69, pulse 57, temperature 97.9 F (36.6 C), temperature source Oral, resp. rate 20, last menstrual period 01/04/2014. Maternal Exam:  Abdomen: Patient reports no abdominal tenderness. Fetal presentation: vertex  Pelvis: adequate for delivery.   Cervix: Cervix evaluated by digital exam.     Physical Exam  Nursing note and vitals reviewed. Constitutional: She is oriented to person, place, and time.  She appears well-developed and well-nourished.  HENT:  Head: Normocephalic and atraumatic.  Eyes: Conjunctivae are normal. Pupils are equal, round, and reactive to light.  Neck: Normal range of motion. Neck supple.  Cardiovascular: Normal rate and regular rhythm.   Respiratory: Effort normal.  GI: Soft.  Genitourinary: Vagina normal and uterus normal.  Musculoskeletal: Normal range of motion.  Neurological: She is alert and oriented to person, place, and time.  Skin: Skin is warm and dry.  Psychiatric: She has a normal mood and affect. Her behavior is normal. Judgment and thought content normal.    Prenatal labs: ABO, Rh: A/POS/-- (04/24 1043) Antibody: NEG (04/24 1043) Rubella: 2.84 (04/24 1043) RPR: NON REAC (08/04 1141)  HBsAg: NEGATIVE (04/24 1043)  HIV: NONREACTIVE (08/04 1141)  GBS: Detected (09/30 1212)   Assessment/Plan: 37.1 weeks.  Multiparity.  UC's.  R/O labor.  Admit.   HARPER,CHARLES A 09/11/2014, 12:01 AM

## 2014-09-11 NOTE — Anesthesia Preprocedure Evaluation (Addendum)
Anesthesia Evaluation  Patient identified by MRN, date of birth, ID band Patient awake    Reviewed: Allergy & Precautions, H&P , NPO status , Patient's Chart, lab work & pertinent test results, reviewed documented beta blocker date and time   History of Anesthesia Complications Negative for: history of anesthetic complications  Airway Mallampati: I TM Distance: >3 FB Neck ROM: full    Dental  (+) Teeth Intact   Pulmonary Current Smoker,  breath sounds clear to auscultation        Cardiovascular negative cardio ROS  Rhythm:regular Rate:Normal     Neuro/Psych negative neurological ROS  negative psych ROS   GI/Hepatic negative GI ROS, Neg liver ROS,   Endo/Other  negative endocrine ROS  Renal/GU negative Renal ROS     Musculoskeletal   Abdominal   Peds  Hematology negative hematology ROS (+)   Anesthesia Other Findings Reports 2 days after delivery of her first child, she had seizure [place] a [place] 33 [place]  No problems since.  Reproductive/Obstetrics (+) Pregnancy                          Anesthesia Physical Anesthesia Plan  ASA: II  Anesthesia Plan: Epidural   Post-op Pain Management:    Induction:   Airway Management Planned:   Additional Equipment:   Intra-op Plan:   Post-operative Plan:   Informed Consent: I have reviewed the patients History and Physical, chart, labs and discussed the procedure including the risks, benefits and alternatives for the proposed anesthesia with the patient or authorized representative who has indicated his/her understanding and acceptance.     Plan Discussed with:   Anesthesia Plan Comments:         Anesthesia Quick Evaluation

## 2014-09-12 LAB — CBC
HEMATOCRIT: 29.2 % — AB (ref 36.0–46.0)
Hemoglobin: 10.1 g/dL — ABNORMAL LOW (ref 12.0–15.0)
MCH: 31.7 pg (ref 26.0–34.0)
MCHC: 34.6 g/dL (ref 30.0–36.0)
MCV: 91.5 fL (ref 78.0–100.0)
Platelets: 135 10*3/uL — ABNORMAL LOW (ref 150–400)
RBC: 3.19 MIL/uL — AB (ref 3.87–5.11)
RDW: 13.4 % (ref 11.5–15.5)
WBC: 8.5 10*3/uL (ref 4.0–10.5)

## 2014-09-12 NOTE — Anesthesia Postprocedure Evaluation (Signed)
Anesthesia Post Note  Patient: Mckenzie [name]  Procedure(s) Performed: * No procedures listed *  Anesthesia type: Epidural  Patient location: Mother/Baby  Post pain: Pain level controlled  Post assessment: Post-op Vital signs reviewed  Last Vitals:  Filed Vitals:   09/12/14 0553  BP: 120/55  Pulse: 55  Temp: 36.6 C  Resp: 16    Post vital signs: Reviewed  Level of consciousness:alert  Complications: No apparent anesthesia complications

## 2014-09-12 NOTE — Progress Notes (Signed)
Patient ID: Mckenzie [name], female   DOB: 07-01-1981, 33 y.o.   MRN: [id number] Postpartum day one Vital signs normal Fundus firm Lochia moderate Doing well

## 2014-09-13 NOTE — Discharge Instructions (Signed)
Discharge instructions ° °· You can wash your hair °· Shower °· Eat what you want °· Drink what you want °· See me in 6 weeks °· Your ankles are going to swell more in the next 2 weeks than when pregnant °· No sex for 6 weeks ° ° °Jaray Boliver A, MD 09/13/2014 ° ° °

## 2014-09-13 NOTE — Discharge Summary (Signed)
Obstetric Discharge Summary Reason for Admission: onset of labor Prenatal Procedures: none Intrapartum Procedures: spontaneous vaginal delivery Postpartum Procedures: none Complications-Operative and Postpartum: none Hemoglobin  Date Value Ref Range Status  09/12/2014 10.1* 12.0 - 15.0 g/dL Final     HCT  Date Value Ref Range Status  09/12/2014 29.2* 36.0 - 46.0 % Final    Physical Exam:  General: alert Lochia: appropriate Uterine Fundus: firm Incision: healing well DVT Evaluation: No evidence of DVT seen on physical exam.  Discharge Diagnoses: Term Pregnancy-delivered  Discharge Information: Date: 09/13/2014 Activity: pelvic rest Diet: routine Medications: Percocet Condition: stable Instructions: refer to practice specific booklet Discharge to: home Follow-up Information   Follow up with HARPER,CHARLES A, MD.   Specialty:  Obstetrics and Gynecology   Contact information:   [address] [place] [id number] [phone]       Newborn Data: Live born female  Birth Weight: 5 lb 9.4 oz (2535 g) APGAR: 9, 9  Home with mother.  MARSHALL,BERNARD A 09/13/2014, 5:52 AM

## 2014-09-16 ENCOUNTER — Encounter: Payer: Medicaid Other | Admitting: Obstetrics

## 2014-09-17 ENCOUNTER — Telehealth: Payer: Self-pay | Admitting: *Deleted

## 2014-09-17 NOTE — Telephone Encounter (Signed)
Smart start called with blood pressure readings left arm 140/90 Right ram 148/84 fluttering in chest area and throbbing on left hand side. Swelling in feet and ankles has started to go down. Per Dr. Clearance Mckenzie [name] patient advised to come in tomorrow for a blood pressure check. Patient notified to come in anytime tomorrow before 2:00PM. Patient voiced understanding.

## 2014-09-21 NOTE — Progress Notes (Signed)
Post discharge chart review completed.  

## 2014-09-25 ENCOUNTER — Telehealth: Payer: Self-pay | Admitting: *Deleted

## 2014-09-25 NOTE — Telephone Encounter (Signed)
Patient called stating she is having pain form her neck to her thighs. Patient states she is out of her pain medication she was given after delivery. Patient had a vaginal delivery on 09-11-14. Patient advised would send message to the doctor and she should expect a call back. Patient also scheduled her PP visit.

## 2014-09-30 ENCOUNTER — Telehealth: Payer: Self-pay | Admitting: *Deleted

## 2014-09-30 NOTE — Telephone Encounter (Signed)
Patient states she is in need of returning to work. Patient is requesting a letter to return to work. Patient delivered vaginally on September 11, 2014. Patient states she is not currently having any complications. Patient states the pain she was having last week has resolved.   Patient wants to know if it is okay to return.

## 2014-09-30 NOTE — Telephone Encounter (Signed)
Patient states she is no longer having this pain and no longer needs this prescription.

## 2014-10-01 NOTE — Telephone Encounter (Signed)
OK to return to work 

## 2014-10-02 ENCOUNTER — Encounter: Payer: Self-pay | Admitting: *Deleted

## 2014-10-02 NOTE — Telephone Encounter (Signed)
Patient advised to come by office to pick up return to work note.

## 2014-10-05 ENCOUNTER — Encounter (HOSPITAL_COMMUNITY): Payer: Self-pay | Admitting: *Deleted

## 2014-10-06 ENCOUNTER — Ambulatory Visit (INDEPENDENT_AMBULATORY_CARE_PROVIDER_SITE_OTHER): Payer: Medicaid Other | Admitting: Obstetrics

## 2014-10-06 ENCOUNTER — Encounter: Payer: Self-pay | Admitting: Obstetrics

## 2014-10-06 DIAGNOSIS — Z30014 Encounter for initial prescription of intrauterine contraceptive device: Secondary | ICD-10-CM

## 2014-10-06 DIAGNOSIS — O1002 Pre-existing essential hypertension complicating childbirth: Secondary | ICD-10-CM

## 2014-10-06 HISTORY — DX: Pre-existing essential hypertension complicating childbirth: O10.02

## 2014-10-06 MED ORDER — TRIAMTERENE-HCTZ 50-25 MG PO CAPS: 1.0000 | ORAL_CAPSULE | ORAL | Status: DC

## 2014-10-06 MED ORDER — AMLODIPINE BESYLATE 5 MG PO TABS: 5.0000 mg | ORAL_TABLET | Freq: Every day | ORAL | Status: DC

## 2014-10-06 NOTE — Progress Notes (Signed)
Subjective:     Mckenzie [name] is a 33 y.o. female who presents for a postpartum visit. She is 3 weeks postpartum following a spontaneous vaginal delivery. I have fully reviewed the prenatal and intrapartum course. The delivery was at 37 gestational weeks. Outcome: spontaneous vaginal delivery. Anesthesia: epidural. Postpartum course has been normal. Baby's course has been normal. Baby is feeding by bottle Rush Barer- Gerber. Bleeding thin lochia. Bowel function is normal. Bladder function is normal. Patient is not sexually active. Contraception method is abstinence. Postpartum depression screening: negative.  Tobacco, alcohol and substance abuse history reviewed.  Adult immunizations reviewed including TDAP, rubella and varicella.  The following portions of the patient's history were reviewed and updated as appropriate: allergies, current medications, past family history, past medical history, past social history, past surgical history and problem list.  Review of Systems A comprehensive review of systems was negative.   Objective:    BP 167/104 mmHg  Pulse 92  Temp(Src) 97.9 F (36.6 C)  Ht 5\' 6"  (1.676 m)  Wt 112 lb (50.803 kg)  BMI 18.09 kg/m2  Breastfeeding? No   PE:  Deferred   100% of 10 min visit spent on counseling and coordination of care.   Assessment:    Postpartum, 3 weeks.    Hypertension, transient.  Plan:    1. Contraception: Wants IUD.  ParaGuard recommended because of HTN. 2. Dyazide / Norvasc Rx.  Referred to IM. 3. Follow up in: 6 weeks or as needed.   Healthy lifestyle practices reviewed

## 2014-10-13 ENCOUNTER — Telehealth: Payer: Self-pay | Admitting: *Deleted

## 2014-10-13 NOTE — Telephone Encounter (Signed)
Patient is interested in the [facility] LParaGard for contraception. Patient states she has not resumed intercourse since delivery. Patient advised to continue to abstain until after insertion. Patient scheduled 11-17-14 @ 4:15p per Dr. Verdell CarmineHarper's instructions.

## 2014-11-17 ENCOUNTER — Encounter: Payer: Self-pay | Admitting: Obstetrics

## 2014-11-17 ENCOUNTER — Ambulatory Visit (INDEPENDENT_AMBULATORY_CARE_PROVIDER_SITE_OTHER): Payer: Medicaid Other | Admitting: Obstetrics

## 2014-11-17 VITALS — BP 134/89 | HR 64 | Temp 98.2°F | Ht 66.0 in | Wt 113.0 lb

## 2014-11-17 DIAGNOSIS — Z30430 Encounter for insertion of intrauterine contraceptive device: Secondary | ICD-10-CM

## 2014-11-17 DIAGNOSIS — B9689 Other specified bacterial agents as the cause of diseases classified elsewhere: Secondary | ICD-10-CM

## 2014-11-17 DIAGNOSIS — N760 Acute vaginitis: Secondary | ICD-10-CM | POA: Insufficient documentation

## 2014-11-17 DIAGNOSIS — Z113 Encounter for screening for infections with a predominantly sexual mode of transmission: Secondary | ICD-10-CM

## 2014-11-17 DIAGNOSIS — Z01812 Encounter for preprocedural laboratory examination: Secondary | ICD-10-CM

## 2014-11-17 DIAGNOSIS — Z3202 Encounter for pregnancy test, result negative: Secondary | ICD-10-CM

## 2014-11-17 LAB — POCT URINE PREGNANCY: Preg Test, Ur: NEGATIVE

## 2014-11-17 MED ORDER — METRONIDAZOLE 500 MG PO TABS: 500.0000 mg | ORAL_TABLET | Freq: Two times a day (BID) | ORAL | Status: DC

## 2014-11-17 NOTE — Progress Notes (Signed)
IUD Insertion Procedure Note  Pre-operative Diagnosis: Contraception  Post-operative Diagnosis: same  Indications: contraception  Procedure Details  Urine pregnancy test was done in office and result was negative.  The risks (including infection, bleeding, pain, and uterine perforation) and benefits of the procedure were explained to the patient and Written informed consent was obtained.    Cervix cleansed with Betadine. Uterus sounded to 9 cm. IUD inserted without difficulty. String visible and trimmed. Patient tolerated procedure well.  IUD Information: ParaGard, Lot # [id number], Expiration date APR 2021.  Condition: Stable  Complications: None  Plan:  The patient was advised to call for any fever or for prolonged or severe pain or bleeding. She was advised to use NSAID as needed for mild to moderate pain.   Attending Physician Documentation: I was present for or participated in the entire procedure, including opening and closing.

## 2014-11-20 LAB — SURESWAB, VAGINOSIS/VAGINITIS PLUS
Atopobium vaginae: 7.1 Log (cells/mL)
C. GLABRATA, DNA: NOT DETECTED
C. PARAPSILOSIS, DNA: NOT DETECTED
C. TRACHOMATIS RNA, TMA: NOT DETECTED
C. TROPICALIS, DNA: NOT DETECTED
C. albicans, DNA: NOT DETECTED
Gardnerella vaginalis: 8 Log (cells/mL)
LACTOBACILLUS SPECIES: NOT DETECTED Log (cells/mL)
MEGASPHAERA SPECIES: 8 Log (cells/mL)
N. gonorrhoeae RNA, TMA: NOT DETECTED
T. vaginalis RNA, QL TMA: NOT DETECTED

## 2014-11-21 ENCOUNTER — Other Ambulatory Visit: Payer: Self-pay | Admitting: Obstetrics

## 2014-12-29 ENCOUNTER — Ambulatory Visit: Payer: Medicaid Other | Admitting: Obstetrics

## 2015-02-01 ENCOUNTER — Ambulatory Visit: Payer: Medicaid Other | Admitting: Obstetrics

## 2015-02-10 ENCOUNTER — Encounter: Payer: Self-pay | Admitting: Obstetrics

## 2015-02-10 ENCOUNTER — Ambulatory Visit (INDEPENDENT_AMBULATORY_CARE_PROVIDER_SITE_OTHER): Payer: Medicaid Other | Admitting: Obstetrics

## 2015-02-10 VITALS — BP 109/74 | HR 77 | Temp 97.1°F | Ht 66.0 in | Wt 105.0 lb

## 2015-02-10 DIAGNOSIS — Z30431 Encounter for routine checking of intrauterine contraceptive device: Secondary | ICD-10-CM | POA: Diagnosis not present

## 2015-02-10 DIAGNOSIS — Z0000 Encounter for general adult medical examination without abnormal findings: Secondary | ICD-10-CM

## 2015-02-10 DIAGNOSIS — N946 Dysmenorrhea, unspecified: Secondary | ICD-10-CM

## 2015-02-10 MED ORDER — IBUPROFEN 800 MG PO TABS: 800.0000 mg | ORAL_TABLET | Freq: Three times a day (TID) | ORAL | Status: DC | PRN

## 2015-02-10 MED ORDER — OB COMPLETE PETITE 35-5-1-200 MG PO CAPS: 1.0000 | ORAL_CAPSULE | Freq: Every day | ORAL | Status: DC

## 2015-02-10 NOTE — Progress Notes (Signed)
Subjective:        Mckenzie [name] is a 34 y.o. female here for 6 week follow up after ParaGuard IUD insertion.  Current complaints: increased cramping with last period.     Gynecologic History Patient's last menstrual period was 02/07/2015. Contraception: IUD ( ParaGuard ) Last Pap: 4 / 2015. Results were: normal   Obstetric History OB History  Gravida Para Term Preterm AB SAB TAB Ectopic Multiple Living  # Outcome Date GA Lbr Len/2nd Weight Sex Delivery Anes PTL Lv  5 Term 09/11/14 [redacted]w[redacted]d 20:01 / 00:09 5 lb 9.4 oz (2.535 kg) Genella Mech EPI  Y  4 Term 12/26/08    Genella Mech EPI  Y  3 Term 10/09/04    Genella Mech None  Y  2 Term 04/22/03    Genella Mech EPI  Y  1 Term 10/15/99    F Vag-Spont EPI  Y      Past Medical History  Diagnosis Date  . Medical history non-contributory     Past Surgical History  Procedure Laterality Date  . No past surgeries       Current outpatient prescriptions:  .  amLODipine (NORVASC) 5 MG tablet, Take 1 tablet (5 mg total) by mouth daily. (Patient not taking: Reported on 02/10/2015), Disp: 30 tablet, Rfl: 5 .  ibuprofen (ADVIL,MOTRIN) 800 MG tablet, Take 1 tablet (800 mg total) by mouth every 8 (eight) hours as needed., Disp: 30 tablet, Rfl: 5 .  naproxen sodium (ANAPROX) 220 MG tablet, Take 220 mg by mouth 2 (two) times daily with a meal., Disp: , Rfl:  .  Prenat-FeCbn-FeAspGl-FA-Omega (OB COMPLETE PETITE) 35-5-1-200 MG CAPS, Take 1 capsule by mouth daily before breakfast., Disp: 90 capsule, Rfl: 3 .  triamterene-hydrochlorothiazide (DYAZIDE) 50-25 MG per capsule, Take 1 capsule by mouth every morning. (Patient not taking: Reported on 02/10/2015), Disp: 30 capsule, Rfl: 5 No Known Allergies  History  Substance Use Topics  . Smoking status: Current Every Day Smoker -- 1.00 packs/day    Types: Cigarettes  . Smokeless tobacco: Never Used  . Alcohol Use: 0.0 oz/week    0 Standard drinks or equivalent per week     Comment:  socially    History reviewed. No pertinent family history.    Review of Systems  Constitutional: negative for fatigue and weight loss Respiratory: negative for cough and wheezing Cardiovascular: negative for chest pain, fatigue and palpitations Gastrointestinal: negative for abdominal pain and change in bowel habits Musculoskeletal:negative for myalgias Neurological: negative for gait problems and tremors Behavioral/Psych: negative for abusive relationship, depression Endocrine: negative for temperature intolerance   Genitourinary:negative for abnormal menstrual periods, genital lesions, hot flashes, sexual problems and vaginal discharge Integument/breast: negative for breast lump, breast tenderness, nipple discharge and skin lesion(s)    Objective:       BP 109/74 mmHg  Pulse 77  Temp(Src) 97.1 F (36.2 C)  Ht  (1.676 m)  Wt 105 lb (47.628 kg)  BMI 16.96 kg/m2  LMP 02/07/2015                Abdomen:  normal findings: no organomegaly, soft, non-tender and no hernia  Pelvis:  External genitalia: normal general appearance Urinary system: urethral meatus normal and bladder without fullness, nontender Vaginal: normal without tenderness, induration or masses Cervix: normal appearance Adnexa: normal bimanual exam Uterus: anteverted and non-tender, normal size   Lab Review Urine pregnancy test  Assessment:    IUD Surveillance.  Doing well. Dysmenorrhea, mild, normal with 1st few periods after IUD insertion Tobacco Dependence Hypertension.  Stable on meds.   Plan:   Continue IUD.  Explained normal break-in side effects to expect the 1st few months after insertion Ibuprofen Rx MVI's Rx Smoking Cessation methods discussed and recommended Continue Norvasc / Dyazide for HTN   Education reviewed: safe sex/STD prevention. Follow up in: 4 months.  Annual and Pap  Meds ordered this encounter  Medications  . Prenat-FeCbn-FeAspGl-FA-Omega (OB COMPLETE PETITE)  35-5-1-200 MG CAPS    Sig: Take 1 capsule by mouth daily before breakfast.    Dispense:  90 capsule    Refill:  3  . ibuprofen (ADVIL,MOTRIN) 800 MG tablet    Sig: Take 1 tablet (800 mg total) by mouth every 8 (eight) hours as needed.    Dispense:  30 tablet    Refill:  5   Orders Placed This Encounter  Procedures  . SureSwab, Vaginosis/Vaginitis Plus

## 2015-02-14 LAB — SURESWAB, VAGINOSIS/VAGINITIS PLUS
Atopobium vaginae: NOT DETECTED Log (cells/mL)
C. TRACHOMATIS RNA, TMA: NOT DETECTED
C. TROPICALIS, DNA: NOT DETECTED
C. albicans, DNA: NOT DETECTED
C. glabrata, DNA: NOT DETECTED
C. parapsilosis, DNA: NOT DETECTED
Gardnerella vaginalis: NOT DETECTED Log (cells/mL)
LACTOBACILLUS SPECIES: 5.4 Log (cells/mL)
MEGASPHAERA SPECIES: NOT DETECTED Log (cells/mL)
N. gonorrhoeae RNA, TMA: NOT DETECTED
T. vaginalis RNA, QL TMA: NOT DETECTED

## 2015-03-29 ENCOUNTER — Encounter: Payer: Self-pay | Admitting: Obstetrics

## 2015-03-29 ENCOUNTER — Ambulatory Visit (INDEPENDENT_AMBULATORY_CARE_PROVIDER_SITE_OTHER): Payer: Medicaid Other | Admitting: Obstetrics

## 2015-03-29 VITALS — BP 103/66 | HR 70 | Temp 98.1°F | Wt 107.8 lb

## 2015-03-29 DIAGNOSIS — B3731 Acute candidiasis of vulva and vagina: Secondary | ICD-10-CM

## 2015-03-29 DIAGNOSIS — D500 Iron deficiency anemia secondary to blood loss (chronic): Secondary | ICD-10-CM

## 2015-03-29 DIAGNOSIS — B373 Candidiasis of vulva and vagina: Secondary | ICD-10-CM | POA: Diagnosis not present

## 2015-03-29 DIAGNOSIS — Z0000 Encounter for general adult medical examination without abnormal findings: Secondary | ICD-10-CM

## 2015-03-29 DIAGNOSIS — L298 Other pruritus: Secondary | ICD-10-CM | POA: Diagnosis not present

## 2015-03-29 DIAGNOSIS — N898 Other specified noninflammatory disorders of vagina: Secondary | ICD-10-CM

## 2015-03-29 DIAGNOSIS — R399 Unspecified symptoms and signs involving the genitourinary system: Secondary | ICD-10-CM | POA: Diagnosis not present

## 2015-03-29 LAB — CBC
HCT: 38.2 % (ref 36.0–46.0)
Hemoglobin: 12.9 g/dL (ref 12.0–15.0)
MCH: 32.3 pg (ref 26.0–34.0)
MCHC: 33.8 g/dL (ref 30.0–36.0)
MCV: 95.5 fL (ref 78.0–100.0)
MPV: 10.9 fL (ref 8.6–12.4)
PLATELETS: 237 10*3/uL (ref 150–400)
RBC: 4 MIL/uL (ref 3.87–5.11)
RDW: 14.6 % (ref 11.5–15.5)
WBC: 4.9 10*3/uL (ref 4.0–10.5)

## 2015-03-29 LAB — POCT URINALYSIS DIPSTICK
BILIRUBIN UA: NEGATIVE
Glucose, UA: NEGATIVE
KETONES UA: NEGATIVE
Leukocytes, UA: NEGATIVE
Nitrite, UA: NEGATIVE
RBC UA: 250
Spec Grav, UA: 1.015
UROBILINOGEN UA: NEGATIVE
pH, UA: 6

## 2015-03-29 LAB — COMPREHENSIVE METABOLIC PANEL
ALBUMIN: 4 g/dL (ref 3.5–5.2)
ALT: 14 U/L (ref 0–35)
AST: 20 U/L (ref 0–37)
Alkaline Phosphatase: 58 U/L (ref 39–117)
BILIRUBIN TOTAL: 0.4 mg/dL (ref 0.2–1.2)
BUN: 10 mg/dL (ref 6–23)
CO2: 29 meq/L (ref 19–32)
Calcium: 9.1 mg/dL (ref 8.4–10.5)
Chloride: 106 mEq/L (ref 96–112)
Creat: 0.74 mg/dL (ref 0.50–1.10)
Glucose, Bld: 48 mg/dL — ABNORMAL LOW (ref 70–99)
Potassium: 3.7 mEq/L (ref 3.5–5.3)
SODIUM: 141 meq/L (ref 135–145)
Total Protein: 6.3 g/dL (ref 6.0–8.3)

## 2015-03-29 LAB — TSH: TSH: 0.525 u[IU]/mL (ref 0.350–4.500)

## 2015-03-29 MED ORDER — OB COMPLETE PETITE 35-5-1-200 MG PO CAPS: 1.0000 | ORAL_CAPSULE | Freq: Every day | ORAL | Status: DC

## 2015-03-29 MED ORDER — CICLOPIROX OLAMINE 0.77 % EX CREA: TOPICAL_CREAM | Freq: Two times a day (BID) | CUTANEOUS | Status: DC

## 2015-03-29 MED ORDER — FLUCONAZOLE 150 MG PO TABS: 150.0000 mg | ORAL_TABLET | Freq: Once | ORAL | Status: DC

## 2015-03-29 NOTE — Progress Notes (Signed)
Patient ID: Mckenzie [name], female   DOB: [birth year], 34 y.o.   MRN: [id number]  Chief Complaint  Patient presents with  . Urinary Tract Infection    uti symptoms, vaginal itching    HPI Mckenzie [name] is a 34 y.o. female.  Vaginal itching and irritation on the outside only.  Denies vaginal discharge. HPI  Past Medical History  Diagnosis Date  . Medical history non-contributory     Past Surgical History  Procedure Laterality Date  . No past surgeries      No family history on file.  Social History History  Substance Use Topics  . Smoking status: Current Every Day Smoker -- 1.00 packs/day    Types: Cigarettes  . Smokeless tobacco: Never Used  . Alcohol Use: 0.0 oz/week    0 Standard drinks or equivalent per week     Comment: socially    No Known Allergies  Current Outpatient Prescriptions  Medication Sig Dispense Refill  . amLODipine (NORVASC) 5 MG tablet Take 1 tablet (5 mg total) by mouth daily. (Patient not taking: Reported on 02/10/2015) 30 tablet 5  . ciclopirox (LOPROX) 0.77 % cream Apply topically 2 (two) times daily. 60 g 2  . fluconazole (DIFLUCAN) 150 MG tablet Take 1 tablet (150 mg total) by mouth once. 1 tablet 2  . ibuprofen (ADVIL,MOTRIN) 800 MG tablet Take 1 tablet (800 mg total) by mouth every 8 (eight) hours as needed. (Patient not taking: Reported on 03/29/2015) 30 tablet 5  . naproxen sodium (ANAPROX) 220 MG tablet Take 220 mg by mouth 2 (two) times daily with a meal.    . Prenat-FeCbn-FeAspGl-FA-Omega (OB COMPLETE PETITE) 35-5-1-200 MG CAPS Take 1 capsule by mouth daily before breakfast. 30 capsule 11  . triamterene-hydrochlorothiazide (DYAZIDE) 50-25 MG per capsule Take 1 capsule by mouth every morning. (Patient not taking: Reported on 02/10/2015) 30 capsule 5   No current facility-administered medications for this visit.    Review of Systems Review of Systems Constitutional: negative for fatigue and weight loss Respiratory: negative for cough and  wheezing Cardiovascular: negative for chest pain, fatigue and palpitations Gastrointestinal: negative for abdominal pain and change in bowel habits Genitourinary: vaginal itching Integument/breast: negative for nipple discharge Musculoskeletal:negative for myalgias Neurological: negative for gait problems and tremors Behavioral/Psych: negative for abusive relationship, depression Endocrine: negative for temperature intolerance     Blood pressure 103/66, pulse 70, temperature 98.1 F (36.7 C), weight 107 lb 12.8 oz (48.898 kg), last menstrual period 03/23/2015, not currently breastfeeding.  Physical Exam Physical Exam General:   alert  Skin:   no rash or abnormalities  Lungs:   clear to auscultation bilaterally  Heart:   regular rate and rhythm, S1, S2 normal, no murmur, click, rub or gallop  Breasts:   normal without suspicious masses, skin or nipple changes or axillary nodes  Abdomen:  normal findings: no organomegaly, soft, non-tender and no hernia  Pelvis:  External genitalia: normal general appearance Urinary system: urethral meatus normal and bladder without fullness, nontender Vaginal: normal without tenderness, induration or masses Cervix: normal appearance Adnexa: normal bimanual exam Uterus: anteverted and non-tender, normal size     Data Reviewed Labs  Assessment     Vulvovaginitis, probable Candida.     Plan    Diflucan Rx Ciclopirox Cream Rx F/U prn   Orders Placed This Encounter  Procedures  . Urine culture  . SureSwab, Vaginosis/Vaginitis Plus  . Comprehensive metabolic panel  . TSH  . CBC  . POCT urinalysis dipstick  Meds ordered this encounter  Medications  . fluconazole (DIFLUCAN) 150 MG tablet    Sig: Take 1 tablet (150 mg total) by mouth once.    Dispense:  1 tablet    Refill:  2  . ciclopirox (LOPROX) 0.77 % cream    Sig: Apply topically 2 (two) times daily.    Dispense:  60 g    Refill:  2  . Prenat-FeCbn-FeAspGl-FA-Omega (OB  COMPLETE PETITE) 35-5-1-200 MG CAPS    Sig: Take 1 capsule by mouth daily before breakfast.    Dispense:  30 capsule    Refill:  11

## 2015-03-30 LAB — URINE CULTURE

## 2015-03-31 LAB — SURESWAB, VAGINOSIS/VAGINITIS PLUS
Atopobium vaginae: NOT DETECTED Log (cells/mL)
C. ALBICANS, DNA: NOT DETECTED
C. glabrata, DNA: NOT DETECTED
C. parapsilosis, DNA: NOT DETECTED
C. trachomatis RNA, TMA: NOT DETECTED
C. tropicalis, DNA: NOT DETECTED
GARDNERELLA VAGINALIS: 5.1 Log (cells/mL)
LACTOBACILLUS SPECIES: 7 Log (cells/mL)
MEGASPHAERA SPECIES: NOT DETECTED Log (cells/mL)
N. gonorrhoeae RNA, TMA: NOT DETECTED
T. vaginalis RNA, QL TMA: NOT DETECTED

## 2015-06-16 ENCOUNTER — Ambulatory Visit: Payer: Medicaid Other | Admitting: Obstetrics

## 2015-06-21 ENCOUNTER — Encounter: Payer: Self-pay | Admitting: Obstetrics

## 2015-06-21 ENCOUNTER — Ambulatory Visit (INDEPENDENT_AMBULATORY_CARE_PROVIDER_SITE_OTHER): Payer: Medicaid Other | Admitting: Obstetrics

## 2015-06-21 VITALS — BP 112/72 | HR 71 | Temp 98.3°F | Wt 110.0 lb

## 2015-06-21 DIAGNOSIS — Z01419 Encounter for gynecological examination (general) (routine) without abnormal findings: Secondary | ICD-10-CM | POA: Diagnosis not present

## 2015-06-21 DIAGNOSIS — Z124 Encounter for screening for malignant neoplasm of cervix: Secondary | ICD-10-CM | POA: Diagnosis not present

## 2015-06-21 DIAGNOSIS — Z0000 Encounter for general adult medical examination without abnormal findings: Secondary | ICD-10-CM | POA: Diagnosis not present

## 2015-06-21 DIAGNOSIS — Z30431 Encounter for routine checking of intrauterine contraceptive device: Secondary | ICD-10-CM | POA: Diagnosis not present

## 2015-06-21 NOTE — Progress Notes (Signed)
Subjective:        Mckenzie [name] is a 34 y.o. female here for a routine exam.  Current complaints: No complaints.  Recently quit smoking with the E- Cigarettes.  Pleased with IUD.  Personal health questionnaire:  Is patient [name], have a family history of breast and/or ovarian cancer: no Is there a family history of uterine cancer diagnosed at age < 4150, gastrointestinal cancer, urinary tract cancer, family member who is a Personnel officerLynch syndrome-associated carrier: no Is the patient overweight and hypertensive, family history of diabetes, personal history of gestational diabetes, preeclampsia or PCOS: no Is patient over 5855, have PCOS,  family history of premature CHD under age 34, diabetes, smoke, have hypertension or peripheral artery disease:  no At any time, has a partner hit, kicked or otherwise hurt or frightened you?: no Over the past 2 weeks, have you felt down, depressed or hopeless?: no Over the past 2 weeks, have you felt little interest or pleasure in doing things?:no   Gynecologic History Patient's last menstrual period was 06/04/2015. Contraception: IUD Last Pap: 2015. Results were: normal Last mammogram: n/a. Results were: n/a  Obstetric History OB History  Gravida Para Term Preterm AB SAB TAB Ectopic Multiple Living  5 5 5       5     # Outcome Date GA Lbr Len/2nd Weight Sex Delivery Anes PTL Lv  5 Term 09/11/14 6844w1d 20:01 / 00:09 5 lb 9.4 oz (2.535 kg) Genella MechM Vag-Spont EPI  Y  4 Term 12/26/08    Genella MechM Vag-Spont EPI  Y  3 Term 10/09/04    Genella MechM Vag-Spont None  Y  2 Term 04/22/03    Genella MechM Vag-Spont EPI  Y  1 Term 10/15/99    F Vag-Spont EPI  Y      Past Medical History  Diagnosis Date  . Medical history non-contributory     Past Surgical History  Procedure Laterality Date  . No past surgeries      No current outpatient prescriptions on file. No Known Allergies  History  Substance Use Topics  . Smoking status: Former Smoker -- 1.00 packs/day    Types: Cigarettes     Quit date: 05/22/2015  . Smokeless tobacco: Never Used  . Alcohol Use: 0.0 oz/week    0 Standard drinks or equivalent per week     Comment: socially    History reviewed. No pertinent family history.    Review of Systems  Constitutional: negative for fatigue and weight loss Respiratory: negative for cough and wheezing Cardiovascular: negative for chest pain, fatigue and palpitations Gastrointestinal: negative for abdominal pain and change in bowel habits Musculoskeletal:negative for myalgias Neurological: negative for gait problems and tremors Behavioral/Psych: negative for abusive relationship, depression Endocrine: negative for temperature intolerance   Genitourinary:negative for abnormal menstrual periods, genital lesions, hot flashes, sexual problems and vaginal discharge Integument/breast: negative for breast lump, breast tenderness, nipple discharge and skin lesion(s)    Objective:       BP 112/72 mmHg  Pulse 71  Temp(Src) 98.3 F (36.8 C)  Wt 110 lb (49.896 kg)  LMP 06/04/2015 General:   alert  Skin:   no rash or abnormalities  Lungs:   clear to auscultation bilaterally  Heart:   regular rate and rhythm, S1, S2 normal, no murmur, click, rub or gallop  Breasts:   normal without suspicious masses, skin or nipple changes or axillary nodes  Abdomen:  normal findings: no organomegaly, soft, non-tender and no hernia  Pelvis:  External genitalia: normal general appearance Urinary system: urethral meatus normal and bladder without fullness, nontender Vaginal: normal without tenderness, induration or masses Cervix: normal appearance Adnexa: normal bimanual exam Uterus: anteverted and non-tender, normal size   Lab Review Urine pregnancy test Labs reviewed yes Radiologic studies reviewed no    Assessment:    Healthy female exam.    IUD Surveillance   Plan:    Education reviewed: calcium supplements, low fat, low cholesterol diet, safe sex/STD prevention, self  breast exams and weight bearing exercise. Contraception: IUD. Follow up in: 1 year.   No orders of the defined types were placed in this encounter.   No orders of the defined types were placed in this encounter.

## 2015-06-22 ENCOUNTER — Encounter: Payer: Self-pay | Admitting: Obstetrics

## 2015-06-22 NOTE — Addendum Note (Signed)
Addended by: Henriette CombsHATTON, Taima Rada L on: 06/22/2015 10:42 AM   Modules accepted: Orders

## 2015-06-24 LAB — PAP IG AND HPV HIGH-RISK: HPV DNA HIGH RISK: NOT DETECTED

## 2015-06-25 ENCOUNTER — Other Ambulatory Visit: Payer: Self-pay | Admitting: Obstetrics

## 2015-06-25 DIAGNOSIS — B9689 Other specified bacterial agents as the cause of diseases classified elsewhere: Secondary | ICD-10-CM

## 2015-06-25 DIAGNOSIS — N760 Acute vaginitis: Principal | ICD-10-CM

## 2015-06-25 LAB — SURESWAB, VAGINOSIS/VAGINITIS PLUS
Atopobium vaginae: 6.9 Log (cells/mL)
C. ALBICANS, DNA: NOT DETECTED
C. GLABRATA, DNA: NOT DETECTED
C. PARAPSILOSIS, DNA: NOT DETECTED
C. TRACHOMATIS RNA, TMA: NOT DETECTED
C. tropicalis, DNA: NOT DETECTED
LACTOBACILLUS SPECIES: NOT DETECTED Log (cells/mL)
MEGASPHAERA SPECIES: >8 Log (cells/mL)
N. gonorrhoeae RNA, TMA: NOT DETECTED
T. vaginalis RNA, QL TMA: NOT DETECTED

## 2015-06-25 MED ORDER — METRONIDAZOLE 500 MG PO TABS: 500.0000 mg | ORAL_TABLET | Freq: Two times a day (BID) | ORAL | Status: DC

## 2015-07-27 ENCOUNTER — Other Ambulatory Visit: Payer: Self-pay | Admitting: *Deleted

## 2015-07-27 DIAGNOSIS — N760 Acute vaginitis: Principal | ICD-10-CM

## 2015-07-27 DIAGNOSIS — B9689 Other specified bacterial agents as the cause of diseases classified elsewhere: Secondary | ICD-10-CM

## 2015-07-27 MED ORDER — METRONIDAZOLE 500 MG PO TABS: 500.0000 mg | ORAL_TABLET | Freq: Two times a day (BID) | ORAL | Status: DC

## 2015-07-27 NOTE — Progress Notes (Signed)
Pt called to office requesting Rx change to another pharmacy as she is out of state. Return call to pt.  Pt states that she is in [place] and would like Rx for BV to be transferred to pharmacy there.  Metronidazole was reordered to her new pharmacy.   Pt states that she is also needing a refill on her blood pressure medication.  Pt cannot remember what she had been taking and there are no current meds on her med list.  Pt made aware that message would be sent to Dr Clearance Coots for review and approval.  Pt advised that she may expect a call in the next few days. Pt was advised to find PCP in her new area in [place] as she is relocating and may need mediation management.   Please review and advise on blood pressure medication.

## 2016-05-16 ENCOUNTER — Ambulatory Visit (INDEPENDENT_AMBULATORY_CARE_PROVIDER_SITE_OTHER): Payer: Medicaid Other | Admitting: Obstetrics

## 2016-05-16 VITALS — BP 107/76 | HR 78 | Wt 122.0 lb

## 2016-05-16 DIAGNOSIS — N946 Dysmenorrhea, unspecified: Secondary | ICD-10-CM

## 2016-05-16 DIAGNOSIS — Z30431 Encounter for routine checking of intrauterine contraceptive device: Secondary | ICD-10-CM

## 2016-05-16 MED ORDER — IBUPROFEN 800 MG PO TABS: 800.0000 mg | ORAL_TABLET | Freq: Three times a day (TID) | ORAL | Status: DC | PRN

## 2016-05-17 ENCOUNTER — Encounter: Payer: Self-pay | Admitting: Obstetrics

## 2016-05-17 NOTE — Progress Notes (Signed)
Subjective:    Mckenzie [name] is a 35 y.o. female who presents for contraception counseling. The patient has no complaints today. The patient is sexually active. Pertinent past medical history: none.  The information documented in the HPI was reviewed and verified.  Menstrual History: OB History    Gravida Para Term Preterm AB TAB SAB Ectopic Multiple Living   5 5 5       5        Patient's last menstrual period was 05/06/2016.   Patient Active Problem List   Diagnosis Date Noted  . BV (bacterial vaginosis) 11/17/2014  . Benign essential hypertension with delivery 10/06/2014  . Hypertonic, incoordinate, and prolonged uterine contractions 09/11/2014  . Fetal heart deceleration 09/11/2014  . NVD (normal vaginal delivery) 09/11/2014  . Supervision of other normal pregnancy 03/27/2014  . Smoker 03/27/2014   Past Medical History  Diagnosis Date  . Medical history non-contributory     Past Surgical History  Procedure Laterality Date  . No past surgeries       Current outpatient prescriptions:  .  ibuprofen (ADVIL,MOTRIN) 800 MG tablet, Take 1 tablet (800 mg total) by mouth every 8 (eight) hours as needed., Disp: 30 tablet, Rfl: 5 No Known Allergies  Social History  Substance Use Topics  . Smoking status: Former Smoker -- 1.00 packs/day    Types: Cigarettes    Quit date: 05/22/2015  . Smokeless tobacco: Never Used  . Alcohol Use: 0.0 oz/week    0 Standard drinks or equivalent per week     Comment: socially    History reviewed. No pertinent family history.     Review of Systems Constitutional: negative for weight loss Genitourinary:negative for abnormal menstrual periods and vaginal discharge     Objective:   BP 107/76 mmHg  Pulse 78  Wt 122 lb (55.339 kg)  LMP 05/06/2016           General:  Alert and no distress Abdomen:  normal findings: no organomegaly, soft, non-tender and no hernia  Pelvis:  External genitalia: normal general appearance Urinary system:  urethral meatus normal and bladder without fullness, nontender Vaginal: normal without tenderness, induration or masses Cervix: normal appearance Adnexa: normal bimanual exam Uterus: anteverted and non-tender, normal size   Lab Review Urine pregnancy test Labs reviewed yes Radiologic studies reviewed no    Assessment:    35 y.o., continuing IUD, no contraindications.   Plan:    All questions answered. Discussed healthy lifestyle modifications. Follow up in 3 months. Urinalysis. Wet prep.  Meds ordered this encounter  Medications  . ibuprofen (ADVIL,MOTRIN) 800 MG tablet    Sig: Take 1 tablet (800 mg total) by mouth every 8 (eight) hours as needed.    Dispense:  30 tablet    Refill:  5   No orders of the defined types were placed in this encounter.

## 2016-05-19 LAB — NUSWAB VG+, CANDIDA 6SP
ATOPOBIUM VAGINAE: HIGH {score} — AB
BVAB 2: HIGH Score — AB
CANDIDA GLABRATA, NAA: NEGATIVE
CANDIDA LUSITANIAE, NAA: NEGATIVE
CANDIDA TROPICALIS, NAA: NEGATIVE
Candida albicans, NAA: NEGATIVE
Candida krusei, NAA: NEGATIVE
Candida parapsilosis, NAA: NEGATIVE
Chlamydia trachomatis, NAA: NEGATIVE
Megasphaera 1: HIGH Score — AB
NEISSERIA GONORRHOEAE, NAA: NEGATIVE
TRICH VAG BY NAA: NEGATIVE

## 2016-05-22 ENCOUNTER — Other Ambulatory Visit: Payer: Self-pay | Admitting: Obstetrics

## 2016-05-22 DIAGNOSIS — B9689 Other specified bacterial agents as the cause of diseases classified elsewhere: Secondary | ICD-10-CM

## 2016-05-22 DIAGNOSIS — N760 Acute vaginitis: Principal | ICD-10-CM

## 2016-05-22 MED ORDER — METRONIDAZOLE 500 MG PO TABS: 500.0000 mg | ORAL_TABLET | Freq: Two times a day (BID) | ORAL | Status: DC

## 2016-05-24 ENCOUNTER — Encounter: Payer: Self-pay | Admitting: *Deleted

## 2016-06-01 ENCOUNTER — Other Ambulatory Visit: Payer: Self-pay | Admitting: *Deleted

## 2016-06-01 DIAGNOSIS — N760 Acute vaginitis: Principal | ICD-10-CM

## 2016-06-01 DIAGNOSIS — B9689 Other specified bacterial agents as the cause of diseases classified elsewhere: Secondary | ICD-10-CM

## 2016-06-01 MED ORDER — METRONIDAZOLE 0.75 % VA GEL: 1.0000 | Freq: Two times a day (BID) | VAGINAL | Status: DC

## 2016-06-01 NOTE — Progress Notes (Signed)
Pt called to office stating that she cannot tolerate/swallow Flagyl pills.  Pt ask for another medication to treat BV. Pt advised that Metrogel could be sent to pharmacy. Pt made instructed on use. Pt states she will try gel.  Rx for Metrogel was sent to pt pharmacy. Pt advised to call office with any problems.

## 2016-06-21 ENCOUNTER — Ambulatory Visit: Payer: Medicaid Other | Admitting: Obstetrics

## 2016-07-04 ENCOUNTER — Ambulatory Visit: Payer: Medicaid Other | Admitting: Obstetrics

## 2016-08-14 ENCOUNTER — Ambulatory Visit: Payer: Self-pay | Admitting: Obstetrics

## 2016-09-03 ENCOUNTER — Encounter (HOSPITAL_COMMUNITY): Payer: Self-pay | Admitting: Emergency Medicine

## 2016-09-03 ENCOUNTER — Emergency Department (HOSPITAL_COMMUNITY)
Admission: EM | Admit: 2016-09-03 | Discharge: 2016-09-03 | Disposition: A | Discharge status: Home / Self Care | Payer: Medicaid Other | Attending: Emergency Medicine | Admitting: Emergency Medicine

## 2016-09-03 DIAGNOSIS — Z87891 Personal history of nicotine dependence: Secondary | ICD-10-CM | POA: Insufficient documentation

## 2016-09-03 DIAGNOSIS — Z791 Long term (current) use of non-steroidal anti-inflammatories (NSAID): Secondary | ICD-10-CM | POA: Insufficient documentation

## 2016-09-03 DIAGNOSIS — N751 Abscess of Bartholin's gland: Secondary | ICD-10-CM | POA: Diagnosis present

## 2016-09-03 MED ORDER — LIDOCAINE HCL 1 % IJ SOLN
INTRAMUSCULAR | Status: AC
  Administered 2016-09-03: 20 mL
  Filled 2016-09-03: qty 20

## 2016-09-03 MED ORDER — TRAMADOL HCL 50 MG PO TABS: 50.0000 mg | ORAL_TABLET | Freq: Four times a day (QID) | ORAL | 0 refills | Status: DC | PRN

## 2016-09-03 NOTE — ED Provider Notes (Signed)
[facility]-EMERGENCY DEPT Provider Note   CSN: [id number] Arrival date & time: 09/03/16  1711     History   Chief Complaint Chief Complaint  Patient presents with  . Abscess    HPI Mckenzie [name] is a 35 y.o. female.  HPI   Last night noticed lump right labia, last night was not painful, but today became a little bit uncomfortable 5/10, feels like a ball, like something there.  No redness.  No fevers, no chills. No history of abscess in past.  Denies concerns for STIs.  Past Medical History:  Diagnosis Date  . Medical history non-contributory     Patient Active Problem List   Diagnosis Date Noted  . BV (bacterial vaginosis) 11/17/2014  . Benign essential hypertension with delivery 10/06/2014  . Hypertonic, incoordinate, and prolonged uterine contractions 09/11/2014  . Fetal heart deceleration 09/11/2014  . NVD (normal vaginal delivery) 09/11/2014  . Supervision of other normal pregnancy 03/27/2014  . Smoker 03/27/2014    Past Surgical History:  Procedure Laterality Date  . NO PAST SURGERIES      OB History    Gravida Para Term Preterm AB Living   5 5 5     5    SAB TAB Ectopic Multiple Live Births           5       Home Medications    Prior to Admission medications   Medication Sig Start Date End Date Taking? Authorizing Provider  ibuprofen (ADVIL,MOTRIN) 800 MG tablet Take 1 tablet (800 mg total) by mouth every 8 (eight) hours as needed. 05/16/16   Brock Bad, MD  metroNIDAZOLE (FLAGYL) 500 MG tablet Take 1 tablet (500 mg total) by mouth 2 (two) times daily. 05/22/16   Brock Bad, MD  metroNIDAZOLE (METROGEL VAGINAL) 0.75 % vaginal gel Place 1 Applicatorful vaginally 2 (two) times daily. 06/01/16   Brock Bad, MD  traMADol (ULTRAM) 50 MG tablet Take 1 tablet (50 mg total) by mouth every 6 (six) hours as needed. 09/03/16   Alvira Monday, MD    Family History No family history on file.  Social History Social History  Substance Use Topics    . Smoking status: Former Smoker    Packs/day: 0.50    Types: Cigarettes    Quit date: 05/22/2015  . Smokeless tobacco: Never Used  . Alcohol use 0.0 oz/week     Comment: socially     Allergies   Review of patient's allergies indicates no known allergies.   Review of Systems Review of Systems  Constitutional: Negative for fever.  HENT: Negative for sore throat.   Eyes: Negative for visual disturbance.  Respiratory: Negative for cough and shortness of breath.   Cardiovascular: Negative for chest pain.  Gastrointestinal: Negative for abdominal pain, nausea and vomiting.  Genitourinary: Positive for vaginal pain (area of swelling right side of vagina). Negative for dysuria, vaginal bleeding and vaginal discharge.  Musculoskeletal: Negative for back pain and neck pain.  Skin: Rash: area of swelling.  Neurological: Negative for syncope and headaches.     Physical Exam Updated Vital Signs BP 113/74 (BP Location: Left Arm)   Pulse 80   Temp 98.5 F (36.9 C) (Oral)   Resp 16   Ht 5\' 6"  (1.676 m)   Wt 130 lb (59 kg)   LMP 08/18/2016   SpO2 100%   BMI 20.98 kg/m   Physical Exam  Constitutional: She is oriented to person, place, and time. She appears well-developed and  well-nourished. No distress.  HENT:  Head: Normocephalic and atraumatic.  Eyes: Conjunctivae and EOM are normal.  Neck: Normal range of motion.  Cardiovascular: Normal rate, regular rhythm and intact distal pulses.   Pulmonary/Chest: Effort normal and breath sounds normal. No respiratory distress.  Abdominal: Soft. She exhibits no distension. There is no tenderness. There is no guarding.  Genitourinary:     Musculoskeletal: She exhibits no edema or tenderness.  Neurological: She is alert and oriented to person, place, and time.  Skin: Skin is warm and dry. No rash noted. She is not diaphoretic. No erythema.  Nursing note and vitals reviewed.    ED Treatments / Results  Labs (all labs ordered are  listed, but only abnormal results are displayed) Labs Reviewed - No data to display  EKG  EKG Interpretation None       Radiology No results found.  Procedures .Marland Kitchen.Incision and Drainage Date/Time: 09/03/2016 6:47 PM Performed by: Alvira MondaySCHLOSSMAN, Corene Resnick Authorized by: Alvira MondaySCHLOSSMAN, Siri Buege   Consent:    Consent obtained:  Verbal   Consent given by:  Patient   Risks discussed:  Bleeding, incomplete drainage, pain and infection   Alternatives discussed:  No treatment and delayed treatment Location:    Type:  Bartholin cyst   Size:  3cm Pre-procedure details:    Skin preparation:  Betadine Anesthesia (see MAR for exact dosages):    Anesthesia method:  Local infiltration   Local anesthetic:  Lidocaine 1% w/o epi Procedure type:    Complexity:  Simple Procedure details:    Needle aspiration: no     Incision types:  Single straight and stab incision   Scalpel blade:  11   Drainage:  Purulent and serosanguinous   Drainage amount:  Copious   Wound treatment:  Wound left open   Packing materials:  None Post-procedure details:    Patient tolerance of procedure:  Tolerated well, no immediate complications   (including critical care time)  Medications Ordered in ED Medications  lidocaine (XYLOCAINE) 1 % (with pres) injection (20 mLs  Given 09/03/16 1846)     Initial Impression / Assessment and Plan / ED Course  I have reviewed the triage vital signs and the nursing notes.  Pertinent labs & imaging results that were available during my care of the patient were reviewed by me and considered in my medical decision making (see chart for details).  Clinical Course   35 year old female with no severe medical history presents with concern of right labial swelling. Patient with signs of Bartholin cyst on exam. Given large size, patient symptomatically from this, with pain, and concern for abscess, incision and drainage was performed. Copious amounts of serous followed by purulent discharge  was expressed. Unable to place Word catheter. No sign of overlying sialitis infection, do not feel that antibiotics are indicated. Patient is to follow-up with her OB/GYN as scheduled this Friday.  Patient discharged in stable condition with understanding of reasons to return.   Final Clinical Impressions(s) / ED Diagnoses   Final diagnoses:  Bartholin's gland abscess    New Prescriptions New Prescriptions   TRAMADOL (ULTRAM) 50 MG TABLET    Take 1 tablet (50 mg total) by mouth every 6 (six) hours as needed.     Alvira MondayErin Jakala Herford, MD 09/03/16 [phone]

## 2016-09-03 NOTE — ED Triage Notes (Signed)
Pt comes from home with complaints of a "knot" or "bump" on her vagina.  States she noticed it last night when she was in the shower. Denies having abscesses or cysts in the past.  States there is discomfort but not any severe pain.

## 2016-09-08 ENCOUNTER — Other Ambulatory Visit (HOSPITAL_COMMUNITY)
Admission: RE | Admit: 2016-09-08 | Discharge: 2016-09-08 | Disposition: A | Discharge status: Home / Self Care | Payer: Medicaid Other | Source: Ambulatory Visit | Attending: Obstetrics | Admitting: Obstetrics

## 2016-09-08 ENCOUNTER — Ambulatory Visit (INDEPENDENT_AMBULATORY_CARE_PROVIDER_SITE_OTHER): Payer: Medicaid Other | Admitting: Obstetrics

## 2016-09-08 ENCOUNTER — Encounter: Payer: Self-pay | Admitting: Obstetrics

## 2016-09-08 VITALS — BP 118/79 | HR 74 | Ht 66.0 in

## 2016-09-08 DIAGNOSIS — N760 Acute vaginitis: Secondary | ICD-10-CM | POA: Diagnosis present

## 2016-09-08 DIAGNOSIS — Z1151 Encounter for screening for human papillomavirus (HPV): Secondary | ICD-10-CM | POA: Insufficient documentation

## 2016-09-08 DIAGNOSIS — Z01419 Encounter for gynecological examination (general) (routine) without abnormal findings: Secondary | ICD-10-CM | POA: Insufficient documentation

## 2016-09-08 DIAGNOSIS — Z113 Encounter for screening for infections with a predominantly sexual mode of transmission: Secondary | ICD-10-CM | POA: Insufficient documentation

## 2016-09-08 DIAGNOSIS — Z975 Presence of (intrauterine) contraceptive device: Secondary | ICD-10-CM | POA: Diagnosis not present

## 2016-09-11 ENCOUNTER — Encounter: Payer: Self-pay | Admitting: Obstetrics

## 2016-09-11 LAB — CERVICOVAGINAL ANCILLARY ONLY
Chlamydia: NEGATIVE
Neisseria Gonorrhea: NEGATIVE
Trichomonas: NEGATIVE

## 2016-09-11 LAB — CYTOLOGY - PAP

## 2016-09-11 NOTE — Progress Notes (Signed)
Subjective:        Mckenzie [name] is a 35 y.o. female here for a routine exam.  Current complaints: Recent drainage of Bartholin's Cyst.  .    Personal health questionnaire:  Is patient Mckenzie [name] Jewish, have a family history of breast and/or ovarian cancer: no Is there a family history of uterine cancer diagnosed at age < 850, gastrointestinal cancer, urinary tract cancer, family member who is a Personnel officerLynch syndrome-associated carrier: no Is the patient overweight and hypertensive, family history of diabetes, personal history of gestational diabetes, preeclampsia or PCOS: no Is patient over 6255, have PCOS,  family history of premature CHD under age 35, diabetes, smoke, have hypertension or peripheral artery disease:  no At any time, has a partner hit, kicked or otherwise hurt or frightened you?: no Over the past 2 weeks, have you felt down, depressed or hopeless?: no Over the past 2 weeks, have you felt little interest or pleasure in doing things?:no   Gynecologic History Patient's last menstrual period was 08/18/2016. Contraception: condoms and IUD Last Pap: 2016. Results were: normal Last mammogram: n/a. Results were: n/a  Obstetric History OB History  Gravida Para Term Preterm AB Living  5 5 5     5   SAB TAB Ectopic Multiple Live Births          5    # Outcome Date GA Lbr Len/2nd Weight Sex Delivery Anes PTL Lv  5 Term 09/11/14 6349w1d 20:01 / 00:09 5 lb 9.4 oz (2.535 kg) M Vag-Spont EPI  LIV  4 Term 12/26/08    M Vag-Spont EPI  LIV  3 Term 10/09/04    M Vag-Spont None  LIV  2 Term 04/22/03    Mckenzie PetitM Vag-Spont EPI  LIV  1 Term 10/15/99    F Vag-Spont EPI  LIV      Past Medical History:  Diagnosis Date  . Medical history non-contributory     Past Surgical History:  Procedure Laterality Date  . NO PAST SURGERIES       Current Outpatient Prescriptions:  .  ibuprofen (ADVIL,MOTRIN) 800 MG tablet, Take 1 tablet (800 mg total) by mouth every 8 (eight) hours as needed. (Patient  not taking: Reported on 09/08/2016), Disp: 30 tablet, Rfl: 5 .  traMADol (ULTRAM) 50 MG tablet, Take 1 tablet (50 mg total) by mouth every 6 (six) hours as needed. (Patient not taking: Reported on 09/08/2016), Disp: 10 tablet, Rfl: 0 No Known Allergies  Social History  Substance Use Topics  . Smoking status: Current Every Day Smoker    Packs/day: 0.50    Types: Cigarettes    Last attempt to quit: 05/22/2015  . Smokeless tobacco: Never Used  . Alcohol use 0.0 oz/week     Comment: socially    History reviewed. No pertinent family history.    Review of Systems  Constitutional: negative for fatigue and weight loss Respiratory: negative for cough and wheezing Cardiovascular: negative for chest pain, fatigue and palpitations Gastrointestinal: negative for abdominal pain and change in bowel habits Musculoskeletal:positive for myalgias Neurological: negative for gait problems and tremors Behavioral/Psych: negative for abusive relationship, depression Endocrine: negative for temperature intolerance   Genitourinary:negative for abnormal menstrual periods, genital lesions, hot flashes, sexual problems and vaginal discharge Integument/breast: negative for breast lump, breast tenderness, nipple discharge and skin lesion(s)    Objective:       BP 118/79   Pulse 74   Ht 5\' 6"  (1.676 m)   LMP 08/18/2016  General:  alert  Skin:   no rash or abnormalities  Lungs:   clear to auscultation bilaterally  Heart:   regular rate and rhythm, S1, S2 normal, no murmur, click, rub or gallop  Breasts:   normal without suspicious masses, skin or nipple changes or axillary nodes  Abdomen:  normal findings: no organomegaly, soft, non-tender and no hernia  Pelvis:  External genitalia: normal general appearance Urinary system: urethral meatus normal and bladder without fullness, nontender Vaginal: normal without tenderness, induration or masses Cervix: normal appearance Adnexa: normal bimanual exam Uterus:  anteverted and non-tender, normal size   Lab Review Urine pregnancy test Labs reviewed yes Radiologic studies reviewed no  50% of 20 min visit spent on counseling and coordination of care.   Assessment:    Healthy female exam.    Contraceptive Surveillance.  Pleased with IUD.  Joint aches.   Plan:    Ibuprofen Rx  Education reviewed: low fat, low cholesterol diet, safe sex/STD prevention, self breast exams and weight bearing exercise. Contraception: IUD. Follow up in: 1 year.   No orders of the defined types were placed in this encounter.  Orders Placed This Encounter  Procedures  . HIV antibody (with reflex)     Patient ID: Mckenzie [name], female   DOB: 1981/01/01, 35 y.o.   MRN: [id number]

## 2016-09-13 ENCOUNTER — Other Ambulatory Visit: Payer: Self-pay | Admitting: Obstetrics

## 2016-09-13 DIAGNOSIS — B9689 Other specified bacterial agents as the cause of diseases classified elsewhere: Secondary | ICD-10-CM

## 2016-09-13 DIAGNOSIS — N760 Acute vaginitis: Principal | ICD-10-CM

## 2016-09-13 LAB — CERVICOVAGINAL ANCILLARY ONLY: Candida vaginitis: NEGATIVE

## 2016-09-13 MED ORDER — METRONIDAZOLE 500 MG PO TABS: 500.0000 mg | ORAL_TABLET | Freq: Two times a day (BID) | ORAL | 2 refills | Status: DC

## 2016-09-25 ENCOUNTER — Other Ambulatory Visit: Payer: Self-pay | Admitting: *Deleted

## 2016-09-25 DIAGNOSIS — N760 Acute vaginitis: Principal | ICD-10-CM

## 2016-09-25 DIAGNOSIS — B9689 Other specified bacterial agents as the cause of diseases classified elsewhere: Secondary | ICD-10-CM

## 2016-09-25 MED ORDER — METRONIDAZOLE 500 MG PO TABS: 500.0000 mg | ORAL_TABLET | Freq: Two times a day (BID) | ORAL | 2 refills | Status: DC

## 2016-09-25 MED ORDER — METRONIDAZOLE 0.75 % VA GEL: 1.0000 | Freq: Every day | VAGINAL | 0 refills | Status: DC

## 2017-04-11 ENCOUNTER — Other Ambulatory Visit: Payer: Self-pay | Admitting: Obstetrics

## 2017-04-16 ENCOUNTER — Other Ambulatory Visit: Payer: Self-pay | Admitting: Obstetrics

## 2017-04-16 MED ORDER — METRONIDAZOLE 0.75 % VA GEL: 1.0000 | Freq: Every day | VAGINAL | 0 refills | Status: DC

## 2017-06-25 ENCOUNTER — Encounter: Payer: Self-pay | Admitting: Obstetrics

## 2017-07-27 ENCOUNTER — Other Ambulatory Visit: Payer: Self-pay | Admitting: Obstetrics

## 2017-07-27 DIAGNOSIS — B9689 Other specified bacterial agents as the cause of diseases classified elsewhere: Secondary | ICD-10-CM

## 2017-07-27 DIAGNOSIS — N760 Acute vaginitis: Principal | ICD-10-CM

## 2017-08-01 ENCOUNTER — Other Ambulatory Visit: Payer: Self-pay | Admitting: Obstetrics

## 2017-08-01 DIAGNOSIS — N760 Acute vaginitis: Principal | ICD-10-CM

## 2017-08-01 DIAGNOSIS — B9689 Other specified bacterial agents as the cause of diseases classified elsewhere: Secondary | ICD-10-CM

## 2017-08-01 MED ORDER — METRONIDAZOLE 0.75 % VA GEL: 1.0000 | Freq: Two times a day (BID) | VAGINAL | 2 refills | Status: DC

## 2017-08-01 MED ORDER — METRONIDAZOLE 0.75 % VA GEL: 1.0000 | Freq: Every day | VAGINAL | 0 refills | Status: DC

## 2017-09-11 ENCOUNTER — Emergency Department (HOSPITAL_COMMUNITY)
Admission: EM | Admit: 2017-09-11 | Discharge: 2017-09-11 | Discharge status: Against Medical Advice | Payer: Self-pay | Attending: Emergency Medicine | Admitting: Emergency Medicine

## 2017-09-11 ENCOUNTER — Encounter (HOSPITAL_COMMUNITY): Payer: Self-pay

## 2017-09-11 DIAGNOSIS — Z5321 Procedure and treatment not carried out due to patient leaving prior to being seen by health care provider: Secondary | ICD-10-CM | POA: Insufficient documentation

## 2017-09-11 NOTE — ED Triage Notes (Signed)
Patient states she walked into a low chandelier at 0830 and has a laceration to the left eyebrow.

## 2017-12-10 ENCOUNTER — Ambulatory Visit: Payer: Self-pay | Admitting: Obstetrics

## 2017-12-19 ENCOUNTER — Telehealth: Payer: Self-pay

## 2017-12-19 ENCOUNTER — Other Ambulatory Visit: Payer: Self-pay | Admitting: Obstetrics

## 2017-12-19 DIAGNOSIS — N939 Abnormal uterine and vaginal bleeding, unspecified: Secondary | ICD-10-CM

## 2017-12-19 MED ORDER — NORETHINDRONE-ETH ESTRADIOL 1-35 MG-MCG PO TABS: 1.0000 | ORAL_TABLET | Freq: Every day | ORAL | 0 refills | Status: DC

## 2017-12-19 NOTE — Progress Notes (Signed)
Patient notified

## 2017-12-19 NOTE — Telephone Encounter (Signed)
Returned call to patient, she will come in and pick up samples of [place] per Dr. Clearance CootsHarper.

## 2017-12-24 ENCOUNTER — Other Ambulatory Visit (HOSPITAL_COMMUNITY)
Admission: RE | Admit: 2017-12-24 | Discharge: 2017-12-24 | Disposition: A | Discharge status: Home / Self Care | Payer: Medicaid Other | Source: Ambulatory Visit | Attending: Obstetrics | Admitting: Obstetrics

## 2017-12-24 ENCOUNTER — Encounter: Payer: Self-pay | Admitting: Obstetrics

## 2017-12-24 ENCOUNTER — Ambulatory Visit: Payer: Self-pay | Admitting: Obstetrics

## 2017-12-24 ENCOUNTER — Ambulatory Visit (INDEPENDENT_AMBULATORY_CARE_PROVIDER_SITE_OTHER): Payer: Medicaid Other | Admitting: Obstetrics

## 2017-12-24 VITALS — BP 116/79 | HR 79 | Ht 66.0 in | Wt 125.0 lb

## 2017-12-24 DIAGNOSIS — Z113 Encounter for screening for infections with a predominantly sexual mode of transmission: Secondary | ICD-10-CM

## 2017-12-24 DIAGNOSIS — N939 Abnormal uterine and vaginal bleeding, unspecified: Secondary | ICD-10-CM

## 2017-12-24 DIAGNOSIS — Z01419 Encounter for gynecological examination (general) (routine) without abnormal findings: Secondary | ICD-10-CM | POA: Insufficient documentation

## 2017-12-24 DIAGNOSIS — Z309 Encounter for contraceptive management, unspecified: Secondary | ICD-10-CM

## 2017-12-24 NOTE — Progress Notes (Signed)
LMP:11/22/17 Mammogram: N/A due to age   Last Pap:09/08/2016 no Hx of abnormal paps Colonoscopy:N/A  Contraception: Paragard inserted 11/17/14 PCP: No   STD Screening: Full Panel Requested Pt notes periods are still irregular.

## 2017-12-25 ENCOUNTER — Encounter: Payer: Self-pay | Admitting: Obstetrics

## 2017-12-25 LAB — CERVICOVAGINAL ANCILLARY ONLY
BACTERIAL VAGINITIS: POSITIVE — AB
Candida vaginitis: NEGATIVE
Chlamydia: POSITIVE — AB
Neisseria Gonorrhea: NEGATIVE
Trichomonas: NEGATIVE

## 2017-12-25 NOTE — Progress Notes (Signed)
Subjective:        Mckenzie [name] is a 37 y.o. female here for a routine exam.  Current complaints:  Irregular spotting with ParaGuard IUD.  Personal health questionnaire:  Is patient [name], have a family history of breast and/or ovarian cancer: no Is there a family history of uterine cancer diagnosed at age < 7250, gastrointestinal cancer, urinary tract cancer, family member who is a Personnel officerLynch syndrome-associated carrier: no Is the patient overweight and hypertensive, family history of diabetes, personal history of gestational diabetes, preeclampsia or PCOS: no Is patient over 7455, have PCOS,  family history of premature CHD under age 37, diabetes, smoke, have hypertension or peripheral artery disease:  no At any time, has a partner hit, kicked or otherwise hurt or frightened you?: no Over the past 2 weeks, have you felt down, depressed or hopeless?: no Over the past 2 weeks, have you felt little interest or pleasure in doing things?:no   Gynecologic History Patient's last menstrual period was 11/22/2017 (approximate). Contraception: IUD Last Pap: 2017. Results were: normal Last mammogram: n/a. Results were: n/a  Obstetric History OB History  Gravida Para Term Preterm AB Living  5 5 5     5   SAB TAB Ectopic Multiple Live Births          5    # Outcome Date GA Lbr Len/2nd Weight Sex Delivery Anes PTL Lv  5 Term 09/11/14 [id number] 20:01 / 00:09 5 lb 9.4 oz (2.535 kg) M Vag-Spont EPI  LIV  4 Term 12/26/08    M Vag-Spont EPI  LIV  3 Term 10/09/04    M Vag-Spont None  LIV  2 Term 04/22/03    Judie PetitM Vag-Spont EPI  LIV  1 Term 10/15/99    F Vag-Spont EPI  LIV      Past Medical History:  Diagnosis Date  . Medical history non-contributory     Past Surgical History:  Procedure Laterality Date  . NO PAST SURGERIES       Current Outpatient Medications:  .  PARAGARD INTRAUTERINE COPPER IUD IUD, 1 each by Intrauterine route once., Disp: , Rfl:  .  ibuprofen (ADVIL,MOTRIN) 800 MG  tablet, Take 1 tablet (800 mg total) by mouth every 8 (eight) hours as needed. (Patient not taking: Reported on 09/08/2016), Disp: 30 tablet, Rfl: 5 .  metroNIDAZOLE (FLAGYL) 500 MG tablet, Take 1 tablet (500 mg total) by mouth 2 (two) times daily. (Patient not taking: Reported on 12/24/2017), Disp: 14 tablet, Rfl: 2 .  metroNIDAZOLE (METROGEL VAGINAL) 0.75 % vaginal gel, Place 1 Applicatorful vaginally 2 (two) times daily. (Patient not taking: Reported on 12/24/2017), Disp: 70 g, Rfl: 2 .  norethindrone-ethinyl estradiol 1/35 (ORTHO-NOVUM 1/35, 28,) tablet, Take 1 tablet by mouth daily. (Patient not taking: Reported on 12/24/2017), Disp: 1 Package, Rfl: 0 .  traMADol (ULTRAM) 50 MG tablet, Take 1 tablet (50 mg total) by mouth every 6 (six) hours as needed. (Patient not taking: Reported on 09/08/2016), Disp: 10 tablet, Rfl: 0 No Known Allergies  Social History   Tobacco Use  . Smoking status: Current Every Day Smoker    Packs/day: 0.50    Types: Cigarettes    Last attempt to quit: 05/22/2015    Years since quitting: 2.5  . Smokeless tobacco: Never Used  Substance Use Topics  . Alcohol use: Yes    Alcohol/week: 0.0 oz    Comment: socially    History reviewed. No pertinent family history.    Review of  Systems  Constitutional: negative for fatigue and weight loss Respiratory: negative for cough and wheezing Cardiovascular: negative for chest pain, fatigue and palpitations Gastrointestinal: negative for abdominal pain and change in bowel habits Musculoskeletal:negative for myalgias Neurological: negative for gait problems and tremors Behavioral/Psych: negative for abusive relationship, depression Endocrine: negative for temperature intolerance    Genitourinary:positive for abnormal menstrual periods, and negative for genital lesions, hot flashes, sexual problems and vaginal discharge Integument/breast: negative for breast lump, breast tenderness, nipple discharge and skin lesion(s)     Objective:       BP 116/79   Pulse 79   Ht 5\' 6"  (1.676 m)   Wt 125 lb (56.7 kg)   LMP 11/22/2017 (Approximate)   BMI 20.18 kg/m  General:   alert  Skin:   no rash or abnormalities  Lungs:   clear to auscultation bilaterally  Heart:   regular rate and rhythm, S1, S2 normal, no murmur, click, rub or gallop  Breasts:   normal without suspicious masses, skin or nipple changes or axillary nodes  Abdomen:  normal findings: no organomegaly, soft, non-tender and no hernia  Pelvis:  External genitalia: normal general appearance Urinary system: urethral meatus normal and bladder without fullness, nontender Vaginal: normal without tenderness, induration or masses Cervix: normal appearance Adnexa: normal bimanual exam Uterus: anteverted and non-tender, normal size   Lab Review Urine pregnancy test Labs reviewed yes Radiologic studies reviewed no  50% of 20 min visit spent on counseling and coordination of care.   Assessment:     1. Encounter for routine gynecological examination with Papanicolaou smear of cervix Rx: - Cytology - PAP  2. Abnormal uterine bleeding (AUB) - OCP's ( Balcostra ) dispensed for cycle regulation  3. Screening for STD (sexually transmitted disease) Rx: - Cervicovaginal ancillary only   Plan:    Education reviewed: calcium supplements, depression evaluation, low fat, low cholesterol diet, safe sex/STD prevention, self breast exams and weight bearing exercise. Contraception: IUD. Follow up in: 3 months.   No orders of the defined types were placed in this encounter.  No orders of the defined types were placed in this encounter.   Brock Bad MD

## 2017-12-26 ENCOUNTER — Other Ambulatory Visit: Payer: Self-pay | Admitting: Obstetrics

## 2017-12-26 DIAGNOSIS — A749 Chlamydial infection, unspecified: Secondary | ICD-10-CM

## 2017-12-26 DIAGNOSIS — B9689 Other specified bacterial agents as the cause of diseases classified elsewhere: Secondary | ICD-10-CM

## 2017-12-26 DIAGNOSIS — N760 Acute vaginitis: Principal | ICD-10-CM

## 2017-12-26 MED ORDER — CEFIXIME 400 MG PO CAPS: 400.0000 mg | ORAL_CAPSULE | Freq: Once | ORAL | 0 refills | Status: AC

## 2017-12-26 MED ORDER — SECNIDAZOLE 2 G PO PACK: 1.0000 | PACK | Freq: Once | ORAL | 2 refills | Status: AC

## 2017-12-26 MED ORDER — AZITHROMYCIN 500 MG PO TABS: 1000.0000 mg | ORAL_TABLET | Freq: Once | ORAL | 0 refills | Status: AC

## 2017-12-27 LAB — CYTOLOGY - PAP
DIAGNOSIS: NEGATIVE
Diagnosis: REACTIVE
HPV (WINDOPATH): NOT DETECTED

## 2017-12-28 ENCOUNTER — Encounter: Payer: Self-pay | Admitting: Obstetrics

## 2017-12-31 MED ORDER — METRONIDAZOLE 500 MG PO TABS: 500.0000 mg | ORAL_TABLET | Freq: Two times a day (BID) | ORAL | 0 refills | Status: AC

## 2018-09-21 ENCOUNTER — Encounter (HOSPITAL_COMMUNITY): Payer: Self-pay | Admitting: Emergency Medicine

## 2018-09-21 ENCOUNTER — Emergency Department (HOSPITAL_COMMUNITY)
Admission: EM | Admit: 2018-09-21 | Discharge: 2018-09-21 | Disposition: A | Discharge status: Home / Self Care | Payer: Medicaid Other | Attending: Emergency Medicine | Admitting: Emergency Medicine

## 2018-09-21 DIAGNOSIS — M545 Low back pain, unspecified: Secondary | ICD-10-CM

## 2018-09-21 DIAGNOSIS — F17210 Nicotine dependence, cigarettes, uncomplicated: Secondary | ICD-10-CM | POA: Insufficient documentation

## 2018-09-21 DIAGNOSIS — Z79899 Other long term (current) drug therapy: Secondary | ICD-10-CM | POA: Insufficient documentation

## 2018-09-21 LAB — URINALYSIS, ROUTINE W REFLEX MICROSCOPIC
BACTERIA UA: NONE SEEN
Bilirubin Urine: NEGATIVE
Glucose, UA: NEGATIVE mg/dL
Ketones, ur: NEGATIVE mg/dL
Leukocytes, UA: NEGATIVE
Nitrite: NEGATIVE
PROTEIN: NEGATIVE mg/dL
Specific Gravity, Urine: 1.018 (ref 1.005–1.030)
pH: 7 (ref 5.0–8.0)

## 2018-09-21 LAB — PREGNANCY, URINE: Preg Test, Ur: NEGATIVE

## 2018-09-21 MED ORDER — NAPROXEN 500 MG PO TABS: 500.0000 mg | ORAL_TABLET | Freq: Two times a day (BID) | ORAL | 0 refills | Status: DC

## 2018-09-21 MED ORDER — METHOCARBAMOL 500 MG PO TABS
500.0000 mg | ORAL_TABLET | Freq: Once | ORAL | Status: DC
  Filled 2018-09-21: qty 1

## 2018-09-21 MED ORDER — METHOCARBAMOL 500 MG PO TABS: 500.0000 mg | ORAL_TABLET | Freq: Two times a day (BID) | ORAL | 0 refills | Status: DC

## 2018-09-21 MED ORDER — ACETAMINOPHEN 325 MG PO TABS
650.0000 mg | ORAL_TABLET | Freq: Once | ORAL | Status: AC
  Administered 2018-09-21: 650 mg via ORAL
  Filled 2018-09-21: qty 2

## 2018-09-21 NOTE — Discharge Instructions (Addendum)

## 2018-09-21 NOTE — ED Triage Notes (Signed)
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 Reports some numbness to anterior thighs intermittently. Pt is ambulatory in NAD.

## 2018-09-21 NOTE — ED Provider Notes (Signed)
[facility] EMERGENCY DEPARTMENT Provider Note   CSN: [id number] Arrival date & time: 09/21/18  1737     History   Chief Complaint Chief Complaint  Patient presents with  . Back Pain    HPI Mckenzie [name] is a 37 y.o. female with medical history as below who presents emergency department today for back pain.  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  She reports that on Tuesday after completing his shift she started noticing tightness of her entire lower back.  She reports that she was moving patients and had several duties that involved her back earlier that evening.  She notes that the pain has been constant since that time and is worsened when she is bending down to touch her toes/tie her shoes or when she is walking.  She describes this as a achy/tightness in her lower back and rates it as a moderate in severity.  Patient also reports that at times she feels a numbness that she describes as "as if my pants are too tight" around her knees that occur intermittently for a few seconds.  No current numbness or tingling of the legs.  No weakness.  Pain does not radiate into the legs.  She has tried icy hot and Motrin for her symptoms without any relief. She also reports that she has been urinating more frequently and is concerned she may have a UTI. Denies dysuria, flank pain, suprapubic pain, abdominal pain or hematuria. Denies history of cancer, trauma, fever, night pain, IV drug use, recent spinal manipulation or procedures, upper back pain or neck pain, tingling/weakness of the lower extremities, urinary retention, loss of bowel/bladder function, saddle anesthesia, or unexplained weight loss.   HPI  Past Medical History:  Diagnosis Date  . Medical history non-contributory     Patient Active Problem List   Diagnosis Date Noted  . BV (bacterial vaginosis) 11/17/2014  . Benign essential hypertension with delivery 10/06/2014  . Hypertonic,  incoordinate, and prolonged uterine contractions 09/11/2014  . Fetal heart deceleration 09/11/2014  . NVD (normal vaginal delivery) 09/11/2014  . Supervision of other normal pregnancy 03/27/2014  . Smoker 03/27/2014    Past Surgical History:  Procedure Laterality Date  . NO PAST SURGERIES       OB History    Gravida  5   Para  5   Term  5   Preterm      AB      Living  5     SAB      TAB      Ectopic      Multiple      Live Births  5            Home Medications    Prior to Admission medications   Medication Sig Start Date End Date Taking? Authorizing Provider  ibuprofen (ADVIL,MOTRIN) 800 MG tablet Take 1 tablet (800 mg total) by mouth every 8 (eight) hours as needed. Patient not taking: Reported on 09/08/2016 05/16/16   Brock Bad, MD  metroNIDAZOLE (METROGEL VAGINAL) 0.75 % vaginal gel Place 1 Applicatorful vaginally 2 (two) times daily. Patient not taking: Reported on 12/24/2017 08/01/17   Brock Bad, MD  norethindrone-ethinyl estradiol 1/35 (ORTHO-NOVUM 1/35, 28,) tablet Take 1 tablet by mouth daily. Patient not taking: Reported on 12/24/2017 12/19/17   Brock Bad, MD  [facility] INTRAUTERINE COPPER IUD IUD 1 each by Intrauterine route once.    [provider]  traMADol (ULTRAM) 50 MG tablet Take 1 tablet (50 mg total) by mouth every 6 (six) hours as needed. Patient not taking: Reported on 09/08/2016 09/03/16   Alvira Monday, MD    Family History History reviewed. No pertinent family history.  Social History Social History   Tobacco Use  . Smoking status: Current Every Day Smoker    Packs/day: 0.50    Types: Cigarettes    Last attempt to quit: 05/22/2015    Years since quitting: 3.3  . Smokeless tobacco: Never Used  Substance Use Topics  . Alcohol use: Yes    Alcohol/week: 0.0 standard drinks    Comment: socially  . Drug use: No     Allergies   Patient has no known allergies.   Review of Systems Review of  Systems  All other systems reviewed and are negative.    Physical Exam Updated Vital Signs BP 107/74 (BP Location: Right Arm)   Pulse 88   Temp 98.5 F (36.9 C) (Oral)   Resp 18   LMP 09/03/2018   SpO2 100%   Physical Exam  Constitutional: She appears well-developed and well-nourished. No distress.  Non-toxic appearing  HENT:  Head: Normocephalic and atraumatic.  Right Ear: External ear normal.  Left Ear: External ear normal.  Neck: Normal range of motion. Neck supple. No spinous process tenderness present. No neck rigidity. Normal range of motion present.  Cardiovascular: Normal rate, regular rhythm, normal heart sounds and intact distal pulses.  No murmur heard. Pulses:      Radial pulses are 2+ on the right side, and 2+ on the left side.       Dorsalis pedis pulses are 2+ on the right side, and 2+ on the left side.       Posterior tibial pulses are 2+ on the right side, and 2+ on the left side.  Pulmonary/Chest: Effort normal and breath sounds normal. No respiratory distress.  Abdominal: Soft. Bowel sounds are normal. She exhibits no pulsatile midline mass. There is no tenderness. There is no rigidity, no rebound and no CVA tenderness.  Musculoskeletal:  Posterior and appearance appears normal. No evidence of obvious scoliosis or kyphosis. No obvious signs of skin changes, trauma, deformity, infection. No C, T  spine tenderness or step-offs to palpation. No C, T  paraspinal tenderness. Diffuse lumbar paraspinal ttp. No midline lumbar ttp or step offs. Lung expansion normal. Bilateral lower extremity strength 5 out of 5 including extensor hallucis longus. Patellar and Achilles deep tendon reflex 2+ and equal bilaterally. Sensation of lower extremities grossly intact. Gait able but patient notes painful. Lower extremity compartments soft. PT and DP 2+ b/l. Cap refill <2 seconds.   Neurological: She is alert.  No foot drop  Skin: Skin is warm, dry and intact. Capillary refill takes  less than 2 seconds. No rash noted. She is not diaphoretic. No erythema.  No vesicular-like rash noted.  Nursing note and vitals reviewed.    ED Treatments / Results  Labs (all labs ordered are listed, but only abnormal results are displayed) Labs Reviewed  URINALYSIS, ROUTINE W REFLEX MICROSCOPIC - Abnormal; Notable for the following components:      Result Value   Hgb urine dipstick MODERATE (*)    All other components within normal limits  PREGNANCY, URINE    EKG None  Radiology No results found.  Procedures Procedures (including critical care time)  Medications Ordered in ED Medications  methocarbamol (ROBAXIN) tablet 500 mg (500 mg Oral Refused 09/21/18  2039)  acetaminophen (TYLENOL) tablet 650 mg (650 mg Oral Given 09/21/18 2037)     Initial Impression / Assessment and Plan / ED Course  I have reviewed the triage vital signs and the nursing notes.  Pertinent labs & imaging results that were available during my care of the patient were reviewed by me and considered in my medical decision making (see chart for details).     37 y.o. 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  No neurologic deficits and normal neuro exam.  Patient can walk but states it is painful.  No bowel or bladder incontinence.  No urinary retention or saddle anesthesia.  No concern for cauda equina.  No history of trauma, personal history of cancer, night sweats or weight loss that would warrant a x-ray at this time.  No spinal injections, fever or IV drug use that make me concerned for spinal hematoma or abscess.  No pulsatile mass of the abdomen.  No abdominal tenderness to palpation or guarding to make me concern for intra-abdominal pathology.  UA without evidence of UTI. Preg test negative. Suspect patient's symptoms are musculoskeletal in nature.  Will treat the patient with back exercises, activity modification, muscle relaxers, NSAIDs  (no history of kidney disease or GI bleed).  Patient is to follow-up with PCP versus orthopedics.  Strict return precautions discussed.  Patient appears safe for discharge. Note given for work.    Final Clinical Impressions(s) / ED Diagnoses   Final diagnoses:  Acute bilateral low back pain without sciatica    ED Discharge Orders         Ordered    naproxen (NAPROSYN) 500 MG tablet  2 times daily     09/21/18 2215    methocarbamol (ROBAXIN) 500 MG tablet  2 times daily     09/21/18 2215           Princella Pellegrini 09/21/18 2224    Terrilee Files, MD 09/22/18 1209

## 2019-05-19 ENCOUNTER — Ambulatory Visit: Payer: Medicaid Other | Admitting: Obstetrics and Gynecology

## 2019-05-22 ENCOUNTER — Encounter: Payer: Self-pay | Admitting: Certified Nurse Midwife

## 2019-05-22 ENCOUNTER — Ambulatory Visit (INDEPENDENT_AMBULATORY_CARE_PROVIDER_SITE_OTHER): Payer: Medicaid Other | Admitting: Certified Nurse Midwife

## 2019-05-22 ENCOUNTER — Other Ambulatory Visit: Payer: Self-pay

## 2019-05-22 VITALS — BP 123/80 | HR 65 | Wt 122.2 lb

## 2019-05-22 DIAGNOSIS — Z30431 Encounter for routine checking of intrauterine contraceptive device: Secondary | ICD-10-CM

## 2019-05-22 DIAGNOSIS — Z975 Presence of (intrauterine) contraceptive device: Secondary | ICD-10-CM

## 2019-05-22 NOTE — Progress Notes (Signed)
GYNECOLOGY OFFICE VISIT NOTE  History:  38 y.o. [id number] here today for IUD string check. Paragard IUD was placed in 2015. She denies any abnormal vaginal discharge, pelvic pain or other concerns.  She reports not being able to feel her strings since placement and "likes to get strings checked yearly".   Past Medical History:  Diagnosis Date  . Medical history non-contributory     Past Surgical History:  Procedure Laterality Date  . NO PAST SURGERIES      The following portions of the patient's history were reviewed and updated as appropriate: allergies, current medications, past family history, past medical history, past social history, past surgical history and problem list.   Review of Systems:  Pertinent items noted in HPI and remainder of comprehensive ROS otherwise negative.  Objective:  Physical Exam BP 123/80   Pulse 65   Wt 122 lb 3.2 oz (55.4 kg)   LMP 05/16/2019 (Exact Date)   BMI 19.72 kg/m  CONSTITUTIONAL: Well-developed, well-nourished female in no acute distress.  HENT:  Normocephalic, atraumatic.  SKIN: Skin is warm and dry. No rash noted. Not diaphoretic. No erythema. No pallor. NEUROLOGIC: Alert and oriented to person, place, and time.  PSYCHIATRIC: Normal mood and affect. Normal behavior. Normal judgment and thought content. CARDIOVASCULAR: Normal heart rate noted RESPIRATORY: Effort and rate normal ABDOMEN: Soft, no distention noted.   PELVIC: Normal appearing external genitalia; normal appearing vaginal mucosa and cervix. IUD string seen in anteverted cervical os. No abnormal discharge noted.    Assessment & Plan:  1. IUD (intrauterine device) in place - No problems or concerns at this time  - Discussed paragard can remain in place until 2025, patient verbalizes understanding  - Normal pap last year, no pap needed until 2022  Please refer to After Visit Summary for other counseling recommendations.   Lajean Manes, CNM 05/22/2019 11:24 AM

## 2019-05-22 NOTE — Progress Notes (Signed)
Pt has paragard IUD (since 2015), states she has never been able to feel the strings and just wants to get it checked.

## 2019-07-21 ENCOUNTER — Telehealth: Payer: Self-pay

## 2019-07-21 NOTE — Telephone Encounter (Signed)
TC from pt requesting Rx to help stop smoking Pt states chantix was prescribed before.   Please advise.

## 2019-07-23 ENCOUNTER — Other Ambulatory Visit: Payer: Self-pay | Admitting: Obstetrics

## 2019-07-23 DIAGNOSIS — F17200 Nicotine dependence, unspecified, uncomplicated: Secondary | ICD-10-CM

## 2019-07-23 MED ORDER — VARENICLINE TARTRATE 1 MG PO TABS: 1.0000 mg | ORAL_TABLET | Freq: Two times a day (BID) | ORAL | 1 refills | Status: DC

## 2019-07-23 MED ORDER — CHANTIX STARTING MONTH PAK 0.5 MG X 11 & 1 MG X 42 PO TABS: ORAL_TABLET | ORAL | 0 refills | Status: DC

## 2019-07-23 NOTE — Telephone Encounter (Signed)
Chantix Rx 

## 2020-04-28 ENCOUNTER — Other Ambulatory Visit (HOSPITAL_COMMUNITY)
Admission: RE | Admit: 2020-04-28 | Discharge: 2020-04-28 | Disposition: A | Discharge status: Home / Self Care | Payer: Medicaid Other | Source: Ambulatory Visit | Attending: Obstetrics | Admitting: Obstetrics

## 2020-04-28 ENCOUNTER — Other Ambulatory Visit: Payer: Self-pay

## 2020-04-28 ENCOUNTER — Ambulatory Visit (INDEPENDENT_AMBULATORY_CARE_PROVIDER_SITE_OTHER): Payer: Medicaid Other | Admitting: Obstetrics

## 2020-04-28 ENCOUNTER — Encounter: Payer: Self-pay | Admitting: Obstetrics

## 2020-04-28 VITALS — BP 122/78 | HR 90 | Wt 124.0 lb

## 2020-04-28 DIAGNOSIS — F17200 Nicotine dependence, unspecified, uncomplicated: Secondary | ICD-10-CM

## 2020-04-28 DIAGNOSIS — F418 Other specified anxiety disorders: Secondary | ICD-10-CM

## 2020-04-28 DIAGNOSIS — Z113 Encounter for screening for infections with a predominantly sexual mode of transmission: Secondary | ICD-10-CM | POA: Diagnosis not present

## 2020-04-28 DIAGNOSIS — N939 Abnormal uterine and vaginal bleeding, unspecified: Secondary | ICD-10-CM | POA: Diagnosis not present

## 2020-04-28 DIAGNOSIS — Z1272 Encounter for screening for malignant neoplasm of vagina: Secondary | ICD-10-CM

## 2020-04-28 DIAGNOSIS — Z01419 Encounter for gynecological examination (general) (routine) without abnormal findings: Secondary | ICD-10-CM | POA: Diagnosis not present

## 2020-04-28 DIAGNOSIS — N898 Other specified noninflammatory disorders of vagina: Secondary | ICD-10-CM

## 2020-04-28 DIAGNOSIS — Z3009 Encounter for other general counseling and advice on contraception: Secondary | ICD-10-CM

## 2020-04-28 MED ORDER — NORETHINDRONE ACETATE 5 MG PO TABS: 5.0000 mg | ORAL_TABLET | Freq: Every day | ORAL | 1 refills | Status: DC

## 2020-04-28 MED ORDER — SERTRALINE HCL 25 MG PO TABS: 25.0000 mg | ORAL_TABLET | Freq: Every day | ORAL | 5 refills | Status: DC

## 2020-04-28 NOTE — Progress Notes (Signed)
Pt is here for annual gyn exam. Last pap 2019, normal. Pt has paragard IUD since 2015 and would like to discuss removal and other contraception options. Pt reports she has abnormal bleeding with the IUD. Pt requests STD screening today.
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
## 2020-04-29 ENCOUNTER — Other Ambulatory Visit: Payer: Self-pay | Admitting: Obstetrics

## 2020-04-29 DIAGNOSIS — N760 Acute vaginitis: Secondary | ICD-10-CM

## 2020-04-29 LAB — CERVICOVAGINAL ANCILLARY ONLY
Bacterial Vaginitis (gardnerella): POSITIVE — AB
Candida Glabrata: NEGATIVE
Candida Vaginitis: NEGATIVE
Chlamydia: NEGATIVE
Comment: NEGATIVE
Comment: NEGATIVE
Comment: NEGATIVE
Comment: NEGATIVE
Comment: NEGATIVE
Comment: NORMAL
Neisseria Gonorrhea: NEGATIVE
Trichomonas: NEGATIVE

## 2020-04-29 LAB — HEPATITIS B SURFACE ANTIGEN: Hepatitis B Surface Ag: NEGATIVE

## 2020-04-29 LAB — CYTOLOGY - PAP
Comment: NEGATIVE
Diagnosis: NEGATIVE
High risk HPV: NEGATIVE

## 2020-04-29 LAB — HIV ANTIBODY (ROUTINE TESTING W REFLEX): HIV Screen 4th Generation wRfx: NONREACTIVE

## 2020-04-29 LAB — HEPATITIS C ANTIBODY: Hep C Virus Ab: <0.1 s/co ratio (ref 0.0–0.9)

## 2020-04-29 LAB — RPR: RPR Ser Ql: NONREACTIVE

## 2020-04-29 MED ORDER — METRONIDAZOLE 0.75 % VA GEL: 1.0000 | Freq: Two times a day (BID) | VAGINAL | 2 refills | Status: DC

## 2020-05-20 ENCOUNTER — Ambulatory Visit (INDEPENDENT_AMBULATORY_CARE_PROVIDER_SITE_OTHER): Payer: Self-pay | Admitting: Obstetrics and Gynecology

## 2020-05-20 ENCOUNTER — Other Ambulatory Visit: Payer: Self-pay

## 2020-05-20 ENCOUNTER — Encounter: Payer: Self-pay | Admitting: Obstetrics and Gynecology

## 2020-05-20 VITALS — BP 111/75 | HR 72 | Wt 128.0 lb

## 2020-05-20 DIAGNOSIS — Z3009 Encounter for other general counseling and advice on contraception: Secondary | ICD-10-CM

## 2020-05-20 NOTE — Progress Notes (Signed)
GYNECOLOGY OFFICE NOTE  History:  39 y.o. [id number] here today for tubal consult. Has paraguard in place and has very heavy periods. Would like paraguard out and permanent sterilization.     Past Medical History:  Diagnosis Date  . Medical history non-contributory     Past Surgical History:  Procedure Laterality Date  . NO PAST SURGERIES       Current Outpatient Medications:  .  metroNIDAZOLE (METROGEL VAGINAL) 0.75 % vaginal gel, Place 1 Applicatorful vaginally 2 (two) times daily., Disp: 70 g, Rfl: 2 .  PARAGARD INTRAUTERINE COPPER IUD IUD, 1 each by Intrauterine route once., Disp: , Rfl:  .  sertraline (ZOLOFT) 25 MG tablet, Take 1 tablet (25 mg total) by mouth daily., Disp: 30 tablet, Rfl: 5 .  naproxen (NAPROSYN) 500 MG tablet, Take 1 tablet (500 mg total) by mouth 2 (two) times daily. (Patient not taking: Reported on 05/22/2019), Disp: 30 tablet, Rfl: 0 .  norethindrone (AYGESTIN) 5 MG tablet, Take 1 tablet (5 mg total) by mouth daily., Disp: 30 tablet, Rfl: 1 .  traMADol (ULTRAM) 50 MG tablet, Take 1 tablet (50 mg total) by mouth every 6 (six) hours as needed. (Patient not taking: Reported on 09/08/2016), Disp: 10 tablet, Rfl: 0 .  varenicline (CHANTIX CONTINUING MONTH PAK) 1 MG tablet, Take 1 tablet (1 mg total) by mouth 2 (two) times daily. (Patient not taking: Reported on 04/28/2020), Disp: 60 tablet, Rfl: 1 .  varenicline (CHANTIX STARTING MONTH PAK) 0.5 MG X 11 & 1 MG X 42 tablet, Take one 0.5 mg tablet by mouth once daily for 3 days, then increase to one 0.5 mg tablet twice daily for 4 days, then increase to one 1 mg tablet twice daily. (Patient not taking: Reported on 04/28/2020), Disp: 53 tablet, Rfl: 0  The following portions of the patient's history were reviewed and updated as appropriate: allergies, current medications, past family history, past medical history, past social history, past surgical history and problem list.   Review of Systems:  Pertinent items noted in  HPI and remainder of comprehensive ROS otherwise negative.   Objective:  Physical Exam BP 111/75   Pulse 72   Wt 128 lb (58.1 kg)   BMI 20.66 kg/m  CONSTITUTIONAL: Well-developed, well-nourished female in no acute distress.  HENT:  Normocephalic, atraumatic. External right and left ear normal. Oropharynx is clear and moist EYES: Conjunctivae and EOM are normal. Pupils are equal, round, and reactive to light. No scleral icterus.  NECK: Normal range of motion, supple, no masses SKIN: Skin is warm and dry. No rash noted. Not diaphoretic. No erythema. No pallor. NEUROLOGIC: Alert and oriented to person, place, and time. Normal reflexes, muscle tone coordination. No cranial nerve deficit noted. PSYCHIATRIC: Normal mood and affect. Normal behavior. Normal judgment and thought content. CARDIOVASCULAR: Normal heart rate noted RESPIRATORY: Effort normal, no problems with respiration noted ABDOMEN: Soft, no distention noted.   PELVIC: deferred MUSCULOSKELETAL: Normal range of motion. No edema noted.  Labs and Imaging No results found.  Assessment & Plan:   1. Unwanted fertility - Pt desires Paraguard removal due to heavy periods and BTL for permanent sterilization.  - Patient counseled regarding bilateral tubal ligation. Reviewed that this is a permanent procedure and that she will not be able to have children after it is done. Reviewed risks of bilateral tubal ligation including infection, hemorrhage, damage to surrounding tissue and organs, risk of regret. Reviewed that bilateral tubal ligation is not 100% effective and she should  take a pregnancy test if she believes for any reason she may be pregnant. Reviewed slightly increased risk of ectopic pregnancy and need to seek care if she becomes pregnant. She understands this is an elective procedure and again affirms her desire. - will remove paraguard at the time of surgery - reviewed pre/post op and will have scheduler contact her to schedule  surgery  Routine preventative health maintenance measures emphasized. Please refer to After Visit Summary for other counseling recommendations.   Return for no follow up needed at this time.  Total face-to-face time with patient: 20 minutes. Over 50% of encounter was spent on counseling and coordination of care.  Baldemar Lenis, M.D. Attending Center for [employer])

## 2020-06-16 ENCOUNTER — Other Ambulatory Visit: Payer: Self-pay

## 2020-06-16 ENCOUNTER — Encounter (HOSPITAL_BASED_OUTPATIENT_CLINIC_OR_DEPARTMENT_OTHER): Payer: Self-pay | Admitting: Obstetrics and Gynecology

## 2020-06-19 ENCOUNTER — Other Ambulatory Visit (HOSPITAL_COMMUNITY)
Admission: RE | Admit: 2020-06-19 | Discharge: 2020-06-19 | Disposition: A | Discharge status: Home / Self Care | Payer: Medicaid Other | Source: Ambulatory Visit | Attending: Obstetrics and Gynecology | Admitting: Obstetrics and Gynecology

## 2020-06-19 DIAGNOSIS — Z01812 Encounter for preprocedural laboratory examination: Secondary | ICD-10-CM | POA: Diagnosis present

## 2020-06-19 DIAGNOSIS — Z20822 Contact with and (suspected) exposure to covid-19: Secondary | ICD-10-CM | POA: Diagnosis not present

## 2020-06-19 LAB — SARS CORONAVIRUS 2 (TAT 6-24 HRS): SARS Coronavirus 2: NEGATIVE

## 2020-06-21 NOTE — Progress Notes (Signed)
Notified pt to come in for lab work, pt verbalized understanding. 

## 2020-06-22 ENCOUNTER — Encounter (HOSPITAL_BASED_OUTPATIENT_CLINIC_OR_DEPARTMENT_OTHER)
Admission: RE | Admit: 2020-06-22 | Discharge: 2020-06-22 | Disposition: A | Discharge status: Home / Self Care | Payer: Medicaid Other | Source: Ambulatory Visit | Attending: Obstetrics and Gynecology | Admitting: Obstetrics and Gynecology

## 2020-06-22 ENCOUNTER — Encounter (HOSPITAL_COMMUNITY): Payer: Self-pay | Admitting: Anesthesiology

## 2020-06-22 DIAGNOSIS — Z01818 Encounter for other preprocedural examination: Secondary | ICD-10-CM | POA: Insufficient documentation

## 2020-06-22 LAB — CBC
HCT: 42.9 % (ref 36.0–46.0)
Hemoglobin: 14.5 g/dL (ref 12.0–15.0)
MCH: 32.3 pg (ref 26.0–34.0)
MCHC: 33.8 g/dL (ref 30.0–36.0)
MCV: 95.5 fL (ref 80.0–100.0)
Platelets: 195 10*3/uL (ref 150–400)
RBC: 4.49 MIL/uL (ref 3.87–5.11)
RDW: 12.7 % (ref 11.5–15.5)
WBC: 4.2 10*3/uL (ref 4.0–10.5)
nRBC: 0 % (ref 0.0–0.2)

## 2020-06-22 LAB — TYPE AND SCREEN
ABO/RH(D): A POS
Antibody Screen: NEGATIVE

## 2020-06-22 LAB — BASIC METABOLIC PANEL
Anion gap: 6 (ref 5–15)
BUN: 10 mg/dL (ref 6–20)
CO2: 27 mmol/L (ref 22–32)
Calcium: 9.4 mg/dL (ref 8.9–10.3)
Chloride: 106 mmol/L (ref 98–111)
Creatinine, Ser: 0.96 mg/dL (ref 0.44–1.00)
GFR calc Af Amer: >60 mL/min (ref >60)
GFR calc non Af Amer: >60 mL/min (ref >60)
Glucose, Bld: 85 mg/dL (ref 70–99)
Potassium: 3.9 mmol/L (ref 3.5–5.1)
Sodium: 139 mmol/L (ref 135–145)

## 2020-06-22 LAB — POCT PREGNANCY, URINE: Preg Test, Ur: NEGATIVE

## 2020-06-22 NOTE — Progress Notes (Signed)

## 2020-06-22 NOTE — Anesthesia Preprocedure Evaluation (Deleted)
Anesthesia Evaluation    Reviewed: Allergy & Precautions, H&P , Patient's Chart, lab work & pertinent test results, Unable to perform ROS - Chart review only  History of Anesthesia Complications Negative for: history of anesthetic complications  Airway        Dental   Pulmonary Current Smoker,           Cardiovascular hypertension,      Neuro/Psych PSYCHIATRIC DISORDERS Anxiety Depression negative neurological ROS     GI/Hepatic negative GI ROS, Neg liver ROS,   Endo/Other  negative endocrine ROS  Renal/GU negative Renal ROS     Musculoskeletal   Abdominal   Peds  Hematology negative hematology ROS (+)   Anesthesia Other Findings Reports 2 days after delivery of her first child, 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  No problems since.  Reproductive/Obstetrics                             Anesthesia Physical  Anesthesia Plan  ASA: II  Anesthesia Plan: General   Post-op Pain Management:    Induction: Intravenous  PONV Risk Score and Plan: 4 or greater and Ondansetron, Dexamethasone, Midazolam, Scopolamine patch - Pre-op and Treatment may vary due to age or medical condition  Airway Management Planned: Oral ETT  Additional Equipment:   Intra-op Plan:   Post-operative Plan: Extubation in OR  Informed Consent:   Plan Discussed with:   Anesthesia Plan Comments:         Anesthesia Quick Evaluation

## 2020-06-23 ENCOUNTER — Ambulatory Visit (HOSPITAL_BASED_OUTPATIENT_CLINIC_OR_DEPARTMENT_OTHER)
Admission: RE | Admit: 2020-06-23 | Payer: Medicaid Other | Source: Home / Self Care | Admitting: Obstetrics and Gynecology

## 2020-06-23 SURGERY — LIGATION, FALLOPIAN TUBE, LAPAROSCOPIC
Anesthesia: Choice | Laterality: Bilateral

## 2020-06-23 MED ORDER — BUPIVACAINE HCL (PF) 0.5 % IJ SOLN
INTRAMUSCULAR | Status: AC
  Filled 2020-06-23: qty 30

## 2020-06-23 MED ORDER — BUPIVACAINE HCL (PF) 0.25 % IJ SOLN
INTRAMUSCULAR | Status: AC
  Filled 2020-06-23: qty 30

## 2020-06-23 MED ORDER — SILVER NITRATE-POT NITRATE 75-25 % EX MISC
CUTANEOUS | Status: AC
  Filled 2020-06-23: qty 10

## 2020-06-23 MED ORDER — SODIUM CHLORIDE (PF) 0.9 % IJ SOLN
INTRAMUSCULAR | Status: AC
  Filled 2020-06-23: qty 10

## 2020-07-12 ENCOUNTER — Encounter (HOSPITAL_COMMUNITY): Payer: Self-pay | Admitting: *Deleted

## 2020-07-12 ENCOUNTER — Other Ambulatory Visit: Payer: Self-pay

## 2020-07-12 NOTE — Progress Notes (Signed)
Spoke w/ via phone for pre-op interview--- PT Lab needs dos----  Urine prep (per anes.) pre-op orders pending             Lab results------ no COVID test ------ 07-19-2020 @ 0850 Arrive at ------- 0730 NPO after MN NO Solid Food.  Clear liquids from MN until--- 0630 Medications to take morning of surgery ----- NONE Diabetic medication ----- n/a Patient Special Instructions ----- n/a Pre-Op special Istructions ----- sent inbox message request for pre-op orders to dr Kirtland Bouchard. Earlene Plater in epic Patient verbalized understanding of instructions that were given at this phone interview. Patient denies shortness of breath, chest pain, fever, cough at this phone interview.

## 2020-07-19 ENCOUNTER — Other Ambulatory Visit (HOSPITAL_COMMUNITY)
Admission: RE | Admit: 2020-07-19 | Discharge: 2020-07-19 | Disposition: A | Discharge status: Home / Self Care | Payer: Medicaid Other | Source: Ambulatory Visit | Attending: Obstetrics and Gynecology | Admitting: Obstetrics and Gynecology

## 2020-07-19 DIAGNOSIS — Z20822 Contact with and (suspected) exposure to covid-19: Secondary | ICD-10-CM | POA: Insufficient documentation

## 2020-07-19 DIAGNOSIS — Z01812 Encounter for preprocedural laboratory examination: Secondary | ICD-10-CM | POA: Diagnosis not present

## 2020-07-19 LAB — SARS CORONAVIRUS 2 (TAT 6-24 HRS): SARS Coronavirus 2: NEGATIVE

## 2020-07-20 ENCOUNTER — Ambulatory Visit (HOSPITAL_BASED_OUTPATIENT_CLINIC_OR_DEPARTMENT_OTHER)
Admission: RE | Admit: 2020-07-20 | Discharge: 2020-07-20 | Disposition: A | Discharge status: Home / Self Care | Payer: Medicaid Other | Attending: Obstetrics and Gynecology | Admitting: Obstetrics and Gynecology

## 2020-07-20 ENCOUNTER — Ambulatory Visit (HOSPITAL_BASED_OUTPATIENT_CLINIC_OR_DEPARTMENT_OTHER): Payer: Medicaid Other | Admitting: Anesthesiology

## 2020-07-20 ENCOUNTER — Encounter (HOSPITAL_BASED_OUTPATIENT_CLINIC_OR_DEPARTMENT_OTHER): Payer: Self-pay | Admitting: Obstetrics and Gynecology

## 2020-07-20 ENCOUNTER — Encounter (HOSPITAL_BASED_OUTPATIENT_CLINIC_OR_DEPARTMENT_OTHER)
Admission: RE | Disposition: A | Discharge status: Home / Self Care | Payer: Self-pay | Source: Home / Self Care | Attending: Obstetrics and Gynecology

## 2020-07-20 DIAGNOSIS — Z30432 Encounter for removal of intrauterine contraceptive device: Secondary | ICD-10-CM | POA: Insufficient documentation

## 2020-07-20 DIAGNOSIS — N7011 Chronic salpingitis: Secondary | ICD-10-CM | POA: Insufficient documentation

## 2020-07-20 DIAGNOSIS — F329 Major depressive disorder, single episode, unspecified: Secondary | ICD-10-CM | POA: Diagnosis not present

## 2020-07-20 DIAGNOSIS — N7001 Acute salpingitis: Secondary | ICD-10-CM | POA: Insufficient documentation

## 2020-07-20 DIAGNOSIS — Z79899 Other long term (current) drug therapy: Secondary | ICD-10-CM | POA: Insufficient documentation

## 2020-07-20 DIAGNOSIS — Z302 Encounter for sterilization: Secondary | ICD-10-CM | POA: Insufficient documentation

## 2020-07-20 DIAGNOSIS — F17210 Nicotine dependence, cigarettes, uncomplicated: Secondary | ICD-10-CM | POA: Diagnosis not present

## 2020-07-20 DIAGNOSIS — I10 Essential (primary) hypertension: Secondary | ICD-10-CM | POA: Diagnosis not present

## 2020-07-20 HISTORY — PX: LAPAROSCOPIC BILATERAL SALPINGECTOMY: SHX5889

## 2020-07-20 HISTORY — PX: IUD REMOVAL: SHX5392

## 2020-07-20 HISTORY — PX: LAPAROSCOPIC TUBAL LIGATION: SHX1937

## 2020-07-20 HISTORY — DX: Major depressive disorder, single episode, unspecified: F32.9

## 2020-07-20 LAB — POCT I-STAT, CHEM 8
BUN: 15 mg/dL (ref 6–20)
Calcium, Ion: 1.28 mmol/L (ref 1.15–1.40)
Chloride: 103 mmol/L (ref 98–111)
Creatinine, Ser: 0.8 mg/dL (ref 0.44–1.00)
Glucose, Bld: 80 mg/dL (ref 70–99)
HCT: 42 % (ref 36.0–46.0)
Hemoglobin: 14.3 g/dL (ref 12.0–15.0)
Potassium: 5.1 mmol/L (ref 3.5–5.1)
Sodium: 142 mmol/L (ref 135–145)
TCO2: 30 mmol/L (ref 22–32)

## 2020-07-20 LAB — POCT PREGNANCY, URINE: Preg Test, Ur: NEGATIVE

## 2020-07-20 SURGERY — SALPINGECTOMY, BILATERAL, LAPAROSCOPIC
Anesthesia: General | Site: Vagina

## 2020-07-20 MED ORDER — KETOROLAC TROMETHAMINE 30 MG/ML IJ SOLN
INTRAMUSCULAR | Status: AC
  Filled 2020-07-20: qty 1

## 2020-07-20 MED ORDER — OXYCODONE HCL 5 MG/5ML PO SOLN: 5.0000 mg | Freq: Once | ORAL | Status: AC | PRN

## 2020-07-20 MED ORDER — LIDOCAINE 2% (20 MG/ML) 5 ML SYRINGE
INTRAMUSCULAR | Status: AC
  Filled 2020-07-20: qty 5

## 2020-07-20 MED ORDER — FENTANYL CITRATE (PF) 100 MCG/2ML IJ SOLN
INTRAMUSCULAR | Status: AC
  Filled 2020-07-20: qty 2

## 2020-07-20 MED ORDER — PROPOFOL 10 MG/ML IV BOLUS
INTRAVENOUS | Status: DC | PRN
  Administered 2020-07-20: 30 mg via INTRAVENOUS
  Administered 2020-07-20: 170 mg via INTRAVENOUS

## 2020-07-20 MED ORDER — OXYCODONE HCL 5 MG PO TABS: 5.0000 mg | ORAL_TABLET | ORAL | 0 refills | Status: DC | PRN

## 2020-07-20 MED ORDER — SUGAMMADEX SODIUM 200 MG/2ML IV SOLN
INTRAVENOUS | Status: DC | PRN
  Administered 2020-07-20: 200 mg via INTRAVENOUS

## 2020-07-20 MED ORDER — FENTANYL CITRATE (PF) 100 MCG/2ML IJ SOLN
25.0000 ug | INTRAMUSCULAR | Status: DC | PRN
  Administered 2020-07-20: 25 ug via INTRAVENOUS

## 2020-07-20 MED ORDER — FENTANYL CITRATE (PF) 250 MCG/5ML IJ SOLN
INTRAMUSCULAR | Status: DC | PRN
  Administered 2020-07-20: 100 ug via INTRAVENOUS

## 2020-07-20 MED ORDER — MIDAZOLAM HCL 5 MG/5ML IJ SOLN
INTRAMUSCULAR | Status: DC | PRN
  Administered 2020-07-20: 2 mg via INTRAVENOUS

## 2020-07-20 MED ORDER — PROPOFOL 10 MG/ML IV BOLUS
INTRAVENOUS | Status: AC
  Filled 2020-07-20: qty 20

## 2020-07-20 MED ORDER — ONDANSETRON HCL 4 MG/2ML IJ SOLN
INTRAMUSCULAR | Status: AC
  Filled 2020-07-20: qty 2

## 2020-07-20 MED ORDER — LACTATED RINGERS IV SOLN: INTRAVENOUS | Status: DC

## 2020-07-20 MED ORDER — ONDANSETRON HCL 4 MG/2ML IJ SOLN
INTRAMUSCULAR | Status: DC | PRN
  Administered 2020-07-20: 4 mg via INTRAVENOUS

## 2020-07-20 MED ORDER — ROCURONIUM BROMIDE 10 MG/ML (PF) SYRINGE
PREFILLED_SYRINGE | INTRAVENOUS | Status: DC | PRN
  Administered 2020-07-20: 40 mg via INTRAVENOUS

## 2020-07-20 MED ORDER — DEXAMETHASONE SODIUM PHOSPHATE 10 MG/ML IJ SOLN
INTRAMUSCULAR | Status: AC
  Filled 2020-07-20: qty 1

## 2020-07-20 MED ORDER — ONDANSETRON HCL 4 MG/2ML IJ SOLN: 4.0000 mg | Freq: Once | INTRAMUSCULAR | Status: DC | PRN

## 2020-07-20 MED ORDER — KETOROLAC TROMETHAMINE 30 MG/ML IJ SOLN
INTRAMUSCULAR | Status: DC | PRN
  Administered 2020-07-20: 30 mg via INTRAVENOUS

## 2020-07-20 MED ORDER — OXYCODONE HCL 5 MG PO TABS
5.0000 mg | ORAL_TABLET | Freq: Once | ORAL | Status: AC | PRN
  Administered 2020-07-20: 5 mg via ORAL

## 2020-07-20 MED ORDER — MIDAZOLAM HCL 2 MG/2ML IJ SOLN
INTRAMUSCULAR | Status: AC
  Filled 2020-07-20: qty 2

## 2020-07-20 MED ORDER — EPHEDRINE 5 MG/ML INJ
INTRAVENOUS | Status: AC
  Filled 2020-07-20: qty 10

## 2020-07-20 MED ORDER — LIDOCAINE 2% (20 MG/ML) 5 ML SYRINGE
INTRAMUSCULAR | Status: DC | PRN
  Administered 2020-07-20: 60 mg via INTRAVENOUS

## 2020-07-20 MED ORDER — ROCURONIUM BROMIDE 10 MG/ML (PF) SYRINGE
PREFILLED_SYRINGE | INTRAVENOUS | Status: AC
  Filled 2020-07-20: qty 10

## 2020-07-20 MED ORDER — IBUPROFEN 800 MG PO TABS
800.0000 mg | ORAL_TABLET | Freq: Three times a day (TID) | ORAL | 1 refills | Status: DC | PRN
Start: 2020-07-20 — End: 2021-04-07

## 2020-07-20 MED ORDER — OXYCODONE HCL 5 MG PO TABS
ORAL_TABLET | ORAL | Status: AC
  Filled 2020-07-20: qty 1

## 2020-07-20 MED ORDER — EPHEDRINE SULFATE-NACL 50-0.9 MG/10ML-% IV SOSY
PREFILLED_SYRINGE | INTRAVENOUS | Status: DC | PRN
  Administered 2020-07-20: 10 mg via INTRAVENOUS

## 2020-07-20 MED ORDER — DEXAMETHASONE SODIUM PHOSPHATE 10 MG/ML IJ SOLN
INTRAMUSCULAR | Status: DC | PRN
  Administered 2020-07-20: 5 mg via INTRAVENOUS

## 2020-07-20 SURGICAL SUPPLY — 39 items
ADH SKN CLS APL DERMABOND .7 (GAUZE/BANDAGES/DRESSINGS) ×3
APL SRG 38 LTWT LNG FL B (MISCELLANEOUS)
APPLICATOR ARISTA FLEXITIP XL (MISCELLANEOUS) IMPLANT
BAG SPEC RTRVL LRG 6X4 10 (ENDOMECHANICALS)
COVER WAND RF STERILE (DRAPES) ×5 IMPLANT
DERMABOND ADVANCED (GAUZE/BANDAGES/DRESSINGS) ×2
DERMABOND ADVANCED .7 DNX12 (GAUZE/BANDAGES/DRESSINGS) IMPLANT
DRSG COVADERM PLUS 2X2 (GAUZE/BANDAGES/DRESSINGS) IMPLANT
DRSG OPSITE POSTOP 3X4 (GAUZE/BANDAGES/DRESSINGS) ×2 IMPLANT
DRSG TEGADERM 2-3/8X2-3/4 SM (GAUZE/BANDAGES/DRESSINGS) ×2 IMPLANT
DRSG TEGADERM 4X4.75 (GAUZE/BANDAGES/DRESSINGS) ×2 IMPLANT
DURAPREP 26ML APPLICATOR (WOUND CARE) ×5 IMPLANT
GAUZE 4X4 16PLY RFD (DISPOSABLE) ×5 IMPLANT
GAUZE SPONGE 4X4 12PLY STRL LF (GAUZE/BANDAGES/DRESSINGS) ×2 IMPLANT
GLOVE BIOGEL PI IND STRL 6.5 (GLOVE) ×6 IMPLANT
GLOVE BIOGEL PI IND STRL 7.0 (GLOVE) ×6 IMPLANT
GLOVE BIOGEL PI INDICATOR 6.5 (GLOVE) ×4
GLOVE BIOGEL PI INDICATOR 7.0 (GLOVE) ×4
GLOVE ORTHOPEDIC STR SZ6.5 (GLOVE) ×5 IMPLANT
GOWN STRL REUS W/TWL LRG LVL3 (GOWN DISPOSABLE) ×15 IMPLANT
HEMOSTAT ARISTA ABSORB 3G PWDR (HEMOSTASIS) IMPLANT
LIGASURE VESSEL 5MM BLUNT TIP (ELECTROSURGICAL) IMPLANT
NEEDLE INSUFFLATION 120MM (ENDOMECHANICALS) ×5 IMPLANT
NS IRRIG 1000ML POUR BTL (IV SOLUTION) ×5 IMPLANT
PACK LAPAROSCOPY BASIN (CUSTOM PROCEDURE TRAY) ×5 IMPLANT
PACK TRENDGUARD 450 HYBRID PRO (MISCELLANEOUS) IMPLANT
POUCH SPECIMEN RETRIEVAL 10MM (ENDOMECHANICALS) IMPLANT
PROTECTOR NERVE ULNAR (MISCELLANEOUS) ×10 IMPLANT
SET SUCTION IRRIG HYDROSURG (IRRIGATION / IRRIGATOR) IMPLANT
SET TUBE SMOKE EVAC HIGH FLOW (TUBING) ×5 IMPLANT
SLEEVE XCEL OPT CAN 5 100 (ENDOMECHANICALS) ×5 IMPLANT
SUT MNCRL AB 4-0 PS2 18 (SUTURE) ×5 IMPLANT
SUT VICRYL 0 UR6 27IN ABS (SUTURE) ×5 IMPLANT
TOWEL OR 17X26 10 PK STRL BLUE (TOWEL DISPOSABLE) ×8 IMPLANT
TRAY FOLEY W/BAG SLVR 14FR (SET/KITS/TRAYS/PACK) ×5 IMPLANT
TRENDGUARD 450 HYBRID PRO PACK (MISCELLANEOUS) ×5
TROCAR BLADELESS OPT 5 100 (ENDOMECHANICALS) ×5 IMPLANT
TROCAR XCEL NON-BLD 11X100MML (ENDOMECHANICALS) ×5 IMPLANT
WARMER LAPAROSCOPE (MISCELLANEOUS) ×5 IMPLANT

## 2020-07-20 NOTE — Anesthesia Procedure Notes (Signed)
Procedure Name: Intubation Date/Time: 07/20/2020 9:20 AM Performed by: Myna Bright, CRNA Pre-anesthesia Checklist: Patient identified, Emergency Drugs available, Suction available and Patient being monitored Patient Re-evaluated:Patient Re-evaluated prior to induction Oxygen Delivery Method: Circle system utilized Preoxygenation: Pre-oxygenation with 100% oxygen Induction Type: IV induction Ventilation: Mask ventilation without difficulty Laryngoscope Size: Mac and 3 Grade View: Grade I Tube type: Oral Tube size: 7.0 mm Number of attempts: 1 Airway Equipment and Method: Stylet Placement Confirmation: ETT inserted through vocal cords under direct vision,  positive ETCO2 and breath sounds checked- equal and bilateral Secured at: 21 cm Tube secured with: Tape Dental Injury: Teeth and Oropharynx as per pre-operative assessment

## 2020-07-20 NOTE — OR Nursing (Signed)
IUD removed by Dr. Earlene Plater in [facility] OR 4

## 2020-07-20 NOTE — Transfer of Care (Signed)
Immediate Anesthesia Transfer of Care Note  Patient: Mckenzie [name]  Procedure(s) Performed: LAPAROSCOPIC BILATERAL SALPINGECTOMY (Bilateral ) LAPAROSCOPIC TUBAL LIGATION (Bilateral Abdomen) INTRAUTERINE DEVICE (IUD) REMOVAL (N/A Vagina )  Patient Location: PACU  Anesthesia Type:General  Level of Consciousness: sedated, patient cooperative and responds to stimulation  Airway & Oxygen Therapy: Patient Spontanous Breathing and Patient connected to face mask oxygen  Post-op Assessment: Report given to RN, Post -op Vital signs reviewed and stable and Patient moving all extremities  Post vital signs: Reviewed and stable  Last Vitals:  Vitals Value Taken Time  BP 134/81 07/20/20 1015  Temp    Pulse    Resp 13 07/20/20 1016  SpO2    Vitals shown include unvalidated device data.  Last Pain:  Vitals:   07/20/20 0744  TempSrc: Oral  PainSc: 0-No pain      Patients Stated Pain Goal: 6 (07/20/20 0744)  Complications: No complications documented.

## 2020-07-20 NOTE — H&P (Signed)
OB/GYN Pre-Op History and Physical  Mckenzie [name] is a 39 y.o. [id number] presenting for laparoscopic bilateral salpingectomy and IUD removal. No acute issues today.       Past Medical History:  Diagnosis Date  . MDD (major depressive disorder)     Past Surgical History:  Procedure Laterality Date  . NO PAST SURGERIES      OB History  Gravida Para Term Preterm AB Living  5 5 5     5   SAB TAB Ectopic Multiple Live Births          5    # Outcome Date GA Lbr Len/2nd Weight Sex Delivery Anes PTL Lv  5 Term 09/11/14 [redacted]w[redacted]d 20:01 / 00:09 2535 g M Vag-Spont EPI  LIV  4 Term 12/26/08    M Vag-Spont EPI  LIV  3 Term 10/09/04    M Vag-Spont None  LIV  2 Term 04/22/03    04/24/03 Vag-Spont EPI  LIV  1 Term 10/15/99    F Vag-Spont EPI  LIV    Social History   Socioeconomic History  . Marital status: Single    Spouse name: Not on file  . Number of children: Not on file  . Years of education: Not on file  . Highest education level: Not on file  Occupational History  . Not on file  Tobacco Use  . Smoking status: Current Every Day Smoker    Packs/day: 0.50    Types: Cigarettes    Last attempt to quit: 05/22/2015    Years since quitting: 5.1  . Smokeless tobacco: Never Used  Vaping Use  . Vaping Use: Never used  Substance and Sexual Activity  . Alcohol use: Yes    Alcohol/week: 0.0 standard drinks    Comment: socially  . Drug use: No  . Sexual activity: Yes    Partners: Male    Birth control/protection: I.U.D., Condom  Other Topics Concern  . Not on file  Social History Narrative  . Not on file   Social Determinants of Health   Financial Resource Strain:   . Difficulty of Paying Living Expenses:   Food Insecurity:   . Worried About 05/24/2015 in the Last Year:   . Programme researcher, broadcasting/film/video in the Last Year:   Transportation Needs:   . Barista (Medical):   Freight forwarder Lack of Transportation (Non-Medical):   Physical Activity:   . Days of Exercise per Week:   .  Minutes of Exercise per Session:   Stress:   . Feeling of Stress :   Social Connections:   . Frequency of Communication with Friends and Family:   . Frequency of Social Gatherings with Friends and Family:   . Attends Religious Services:   . Active Member of Clubs or Organizations:   . Attends [employer] Meetings:   Banker Marital Status:     History reviewed. No pertinent family history.  Medications Prior to Admission  Medication Sig Dispense Refill Last Dose  . PARAGARD INTRAUTERINE COPPER IUD IUD 1 each by Intrauterine route once.   07/20/2020 at Unknown time  . sertraline (ZOLOFT) 25 MG tablet Take 1 tablet (25 mg total) by mouth daily. (Patient taking differently: Take 25 mg by mouth at bedtime. ) 30 tablet 5 Past Week at Unknown time    No Known Allergies  Review of Systems: Negative except for what is mentioned in HPI.     Physical Exam: BP 120/77   Pulse 07/22/2020)  56   Temp 97.7 F (36.5 C) (Oral)   Resp 14   Ht 5\' 6"  (1.676 m)   Wt 54.8 kg   LMP 07/05/2020 (Approximate)   SpO2 100%   BMI 19.50 kg/m  CONSTITUTIONAL: Well-developed, well-nourished female in no acute distress.  HENT:  Normocephalic, atraumatic, External right and left ear normal. Oropharynx is clear and moist EYES: Conjunctivae and EOM are normal. Pupils are equal, round, and reactive to light. No scleral icterus.  NECK: Normal range of motion, supple, no masses SKIN: Skin is warm and dry. No rash noted. Not diaphoretic. No erythema. No pallor. NEUROLGIC: Alert and oriented to person, place, and time. Normal reflexes, muscle tone coordination. No cranial nerve deficit noted. PSYCHIATRIC: Normal mood and affect. Normal behavior. Normal judgment and thought content. CARDIOVASCULAR: Normal heart rate noted RESPIRATORY: Effort normal, no problems with respiration noted ABDOMEN: Soft, nondistended, gravid.  PELVIC: Deferred MUSCULOSKELETAL: Normal range of motion. No edema and no tenderness. 2+ distal  pulses.   Pertinent Labs/Studies:   Results for orders placed or performed during the hospital encounter of 07/20/20 (from the past 72 hour(s))  Pregnancy, urine POC     Status: None   Collection Time: 07/20/20  7:40 AM  Result Value Ref Range   Preg Test, Ur NEGATIVE NEGATIVE    Comment:        THE SENSITIVITY OF THIS METHODOLOGY IS >24 mIU/mL   I-STAT, chem 8     Status: None   Collection Time: 07/20/20  7:58 AM  Result Value Ref Range   Sodium 142 135 - 145 mmol/L   Potassium 5.1 3.5 - 5.1 mmol/L   Chloride 103 98 - 111 mmol/L   BUN 15 6 - 20 mg/dL   Creatinine, Ser 07/22/20 0.44 - 1.00 mg/dL   Glucose, Bld 80 70 - 99 mg/dL    Comment: Glucose reference range applies only to samples taken after fasting for at least 8 hours.   Calcium, Ion 1.28 1.15 - 1.40 mmol/L   TCO2 30 22 - 32 mmol/L   Hemoglobin 14.3 12.0 - 15.0 g/dL   HCT 9.50 36 - 46 %       Assessment and Plan :Mckenzie [name] is a 39 y.o. 24 here for laparoscopic bilateral salpingectomy and IUD removal. She has no acute concerns.   The risks of laparoscopic surgery were discussed with the patient including but not limited to: bleeding which may require transfusion or reoperation; infection which may require antibiotics; injury to bowel, bladder, ureters or other surrounding organs; need for additional procedures including laparotomy; thromboembolic phenomenon, incisional problems and other postoperative/anesthesia complications.   Reviewed risks of laparoscopic bilateral tubal ligation with patient. She understands that this is a permanent procedure and she will not be able to have children after it is done. Reviewed risks of bilateral tubal ligation including infection, hemorrhage, damage to surrounding tissue and organs, possibility of laparotomy or other indicated procedures, risk of regret. Reviewed risks of anesthesia including risk of clot, stroke, MI. Reviewed that bilateral tubal ligation is not 100% effective  and she should take a pregnancy test if she believes for any reason she may be pregnant. Reviewed slightly increased risk of ectopic pregnancy and need to seek care if she becomes pregnant. She understands this is an elective procedure. She consents to blood transfusion if necessary. Reviewed expected recovery course.    Plan for laparoscopic bilateral salpingectomy, IUD removal NPO Neg UPT VS Q4 No antibiotics indicated   K. Meryl  Earlene Plater, M.D. Attending Obstetrician & Gynecologist, [facility] for [employer], [facility]

## 2020-07-20 NOTE — Op Note (Signed)
Mckenzie [name] PROCEDURE DATE: 07/20/2020  PREOPERATIVE DIAGNOSES: unwanted fertility and removal of IUD POSTOPERATIVE DIAGNOSES: The same PROCEDURE: laparoscopic bilateral salpingectomy & IUD removal SURGEON:  Dr. Baldemar Lenis ASSISTANT: none ANESTHESIOLOGY TEAM: Anesthesiologist: Mal Amabile, MD CRNA: Lucinda Dell, CRNA  INDICATIONS: 39 y.o. Mckenzie [name] with aforementioned preoperative diagnoses here today for definitive surgical management.   Risks of surgery were discussed with the patient including but not limited to: bleeding which may require transfusion or reoperation; infection which may require antibiotics; injury to bowel, bladder, ureters or other surrounding organs; need for additional procedures including laparotomy; thromboembolic phenomenon, incisional problems and other postoperative/anesthesia complications. Written informed consent was obtained.    FINDINGS:  White IUD strings protruding from os . Normal uterus, fallopian tubes and ovaries bilaterally. No evidence of endometriosis, adhesions or any other abdominal/pelvic abnormality.   ANESTHESIA:  General anesthesia INTRAVENOUS FLUIDS: 600 ml ESTIMATED BLOOD LOSS: 5 ml SPECIMENS:  Left and right fallopian tubes COMPLICATIONS: None immediate   PROCEDURE IN DETAIL:  The patient had sequential compression devices applied to her lower extremities while in the preoperative area.  She was then taken to the operating room where general anesthesia was administered and was found to be adequate.  She was placed in the dorsal lithotomy position, and was prepped and draped in a sterile manner.  A Foley catheter was inserted into her bladder and attached to constant drainage. A timeout was performed to verify patient and procedure. A uterine manipulator was then advanced into the uterus. Attention was then turned to the patient's abdomen where a 5-mm skin incision was made in the umbilical fold.    The 5 mm Optiview port was  advanced under visualization with the endoscope until the peritoneum was entered. The introducer was removed and the camera was replaced within the port to confirm intraperitoneum placement. Once this was accomplished, the carbon dioxide gas was insufflated with an opening pressure of 3 mm Hg. The abdomen was visualized and survey of the abdomen showed atraumatic entry. Survey of the abdomen and pelvis were noted as above. A 5 mm port was placed into the right lower abdomen 2 cm proximal and median from the right ASIS after incision with a scalpel. The port was advanced under direct visualization. An 11 mm port was placed in the suprapubic area after a transverse incision was made with the scalpel. *The port was advanced under direct visualization.   The patient was placed into trendelenburg and the right tube was grasped using an atraumatic grasper. The fallopian tube was removed using a 5 mm Ligasure to ligate and transect along the mesosalpinx just inferior to the tube. Hemostasis noted at ligation site. The left fallopian tube was grasped using an atraumatic grasper and removed using the 5 mm Ligasure device to ligate and transect along the mesosalpinx just inferior to the tube. Hemostasis noted at ligation site. The tubes were removed through the 11 mm port intact. The pelvis was noted to be hemostatic at the ligation sites. The uterus and bilateral ovaries were inspected and noted to be normal. The abdomen was again inspected and no bleeding or other abnormalities noted. At this point the procedure was completed and the trocars were removed from the abdomen. The pneumoperitoneum was removed as much as possible. The fascia of the 10 mm port was closed using 0-Vicryl. The skin of all ports was closed using 4-0 Monocryl. The uterine manipulator was removed and the IUD strings were grasped with a ring forceps and  the IUD was removed intact. The tenaculum was removed with no bleeding noted at the site of the  tenaculum.   The patient was extubated without difficulty and taken to PACU in stable condition. The patient will be discharged to home as per PACU criteria.  Routine postoperative instructions given.  She was prescribed oxycodone, Ibuprofen.  She will follow up in the clinic in 2 weeks for postoperative evaluation.   Baldemar Lenis, M.D. Attending Center for [employer] [facility])

## 2020-07-20 NOTE — Discharge Instructions (Signed)
- You may shower after your surgery. When you shower, let water run over your incisions and pat dry. Do not take a bath or go swimming until you are cleared by your doctor. - A small amount of bleeding or oozing from the incision is normal, if you have a lot or if you are concerned about it, please call the office.  - Do not do any heavy lifting or major activity for at least 4 weeks after your surgery. You should be getting up and moving around the house with daily activities but do not strain yourself. - Do not put ANYTHING in the vagina until cleared by your doctor. This includes tampons, intercourse, douching, fingers, toys, etc. You need time to heal from the surgery. - Some spotting is normal even up to a few weeks after the surgery. If you have bleeding or foul smelling discharge, please call your doctor.   DISCHARGE INSTRUCTIONS: Laparoscopy  The following instructions have been prepared to help you care for yourself upon your return home today.  Wound care: [employer] Kitchen Do not get the incision wet for the first 24 hours. The incision should be kept clean and dry. . The Band-Aids or dressings may be removed the day after surgery. . Should the incision become sore, red, and swollen after the first week, check with your doctor.  Personal hygiene: . Shower the day after your procedure.  Activity and limitations: . Do NOT drive or operate any equipment today. . Do NOT lift anything more than 15 pounds for 2-3 weeks after surgery. . Do NOT rest in bed all day. . Walking is encouraged. Walk each day, starting slowly with 5-minute walks 3 or 4 times a day. Slowly increase the length of your walks. . Walk up and down stairs slowly. . Do NOT do strenuous activities, such as golfing, playing tennis, bowling, running, biking, weight lifting, gardening, mowing, or vacuuming for 2-4 weeks. Ask your doctor when it is okay to start.  Diet: Eat a light meal as desired this evening. You may resume your usual  diet tomorrow.  Return to work: This is dependent on the type of work you do. For the most part you can return to a desk job within a week of surgery. If you are more active at work, please discuss this with your doctor.  What to expect after your surgery: You may have a slight burning sensation when you urinate on the first day. You may have a very small amount of blood in the urine. Expect to have a small amount of vaginal discharge/light bleeding for 1-2 weeks. It is not unusual to have abdominal soreness and bruising for up to 2 weeks. You may be tired and need more rest for about 1 week. You may experience shoulder pain for 24-72 hours. Lying flat in bed may relieve it.  Call your doctor for any of the following: . Develop a fever of 100.4 or greater . Inability to urinate 6 hours after discharge from hospital . Severe pain not relieved by pain medications . Persistent of heavy bleeding at incision site . Redness or swelling around incision site after a week . Increasing nausea or vomiting     IF YOU HAVE ANY QUESTIONS OR CONCERNS, PLEASE CALL THE OFFICE.     Post Anesthesia Home Care Instructions  Activity: Get plenty of rest for the remainder of the day. A responsible individual must stay with you for 24 hours following the procedure.  For the next 24  hours, DO NOT: -Drive a car -Advertising copywriter -Drink alcoholic beverages -Take any medication unless instructed by your physician -Make any legal decisions or sign important papers.  Meals: Start with liquid foods such as gelatin or soup. Progress to regular foods as tolerated. Avoid greasy, spicy, heavy foods. If nausea and/or vomiting occur, drink only clear liquids until the nausea and/or vomiting subsides. Call your physician if vomiting continues.  Special Instructions/Symptoms: Your throat may feel dry or sore from the anesthesia or the breathing tube placed in your throat during surgery. If this causes discomfort,  gargle with warm salt water. The discomfort should disappear within 24 hours.  [employer]land Kitchen

## 2020-07-20 NOTE — Anesthesia Preprocedure Evaluation (Signed)
Anesthesia Evaluation  Patient identified by MRN, date of birth, ID band Patient awake    Reviewed: Allergy & Precautions, NPO status , Patient's Chart, lab work & pertinent test results  Airway Mallampati: I  TM Distance: >3 FB Neck ROM: Full    Dental no notable dental hx. (+) Teeth Intact   Pulmonary Current Smoker and Patient abstained from smoking.,    Pulmonary exam normal breath sounds clear to auscultation       Cardiovascular hypertension, Normal cardiovascular exam Rhythm:Regular Rate:Normal     Neuro/Psych PSYCHIATRIC DISORDERS Depression    GI/Hepatic negative GI ROS, Neg liver ROS,   Endo/Other  negative endocrine ROS  Renal/GU negative Renal ROS  negative genitourinary   Musculoskeletal negative musculoskeletal ROS (+)   Abdominal   Peds  Hematology negative hematology ROS (+)   Anesthesia Other Findings   Reproductive/Obstetrics Undesired fertility                             Anesthesia Physical Anesthesia Plan  ASA: II  Anesthesia Plan: General   Post-op Pain Management:    Induction: Intravenous  PONV Risk Score and Plan: 4 or greater and Midazolam, Ondansetron and Treatment may vary due to age or medical condition  Airway Management Planned: Oral ETT  Additional Equipment:   Intra-op Plan:   Post-operative Plan: Extubation in OR  Informed Consent: I have reviewed the patients History and Physical, chart, labs and discussed the procedure including the risks, benefits and alternatives for the proposed anesthesia with the patient or authorized representative who has indicated his/her understanding and acceptance.     Dental advisory given  Plan Discussed with: CRNA and Anesthesiologist  Anesthesia Plan Comments:         Anesthesia Quick Evaluation

## 2020-07-20 NOTE — Anesthesia Postprocedure Evaluation (Signed)
Anesthesia Post Note  Patient: Mckenzie [name]  Procedure(s) Performed: LAPAROSCOPIC BILATERAL SALPINGECTOMY (Bilateral ) LAPAROSCOPIC TUBAL LIGATION (Bilateral Abdomen) INTRAUTERINE DEVICE (IUD) REMOVAL (N/A Vagina )     Patient location during evaluation: PACU Anesthesia Type: General Level of consciousness: awake and alert and oriented Pain management: pain level controlled Vital Signs Assessment: post-procedure vital signs reviewed and stable Respiratory status: spontaneous breathing, nonlabored ventilation and respiratory function stable Cardiovascular status: blood pressure returned to baseline and stable Postop Assessment: no apparent nausea or vomiting Anesthetic complications: no   No complications documented.  Last Vitals:  Vitals:   07/20/20 1038 07/20/20 1045  BP:  111/73  Pulse:    Resp: 11 12  Temp:    SpO2: 100%     Last Pain:  Vitals:   07/20/20 1045  TempSrc:   PainSc: 0-No pain                 Quandra Fedorchak A.

## 2020-07-21 ENCOUNTER — Encounter (HOSPITAL_BASED_OUTPATIENT_CLINIC_OR_DEPARTMENT_OTHER): Payer: Self-pay | Admitting: Obstetrics and Gynecology

## 2020-07-21 LAB — SURGICAL PATHOLOGY

## 2020-08-04 ENCOUNTER — Encounter: Payer: Medicaid Other | Admitting: Obstetrics and Gynecology

## 2020-08-18 ENCOUNTER — Encounter: Payer: Self-pay | Admitting: Obstetrics and Gynecology

## 2020-08-18 ENCOUNTER — Ambulatory Visit (INDEPENDENT_AMBULATORY_CARE_PROVIDER_SITE_OTHER): Payer: Self-pay | Admitting: Obstetrics and Gynecology

## 2020-08-18 ENCOUNTER — Other Ambulatory Visit: Payer: Self-pay

## 2020-08-18 VITALS — BP 112/72 | HR 71 | Wt 122.0 lb

## 2020-08-18 DIAGNOSIS — Z9079 Acquired absence of other genital organ(s): Secondary | ICD-10-CM

## 2020-08-18 DIAGNOSIS — Z9889 Other specified postprocedural states: Secondary | ICD-10-CM

## 2020-08-18 NOTE — Progress Notes (Signed)
   GYNECOLOGY OFFICE FOLLOW UP NOTE  History:  39 y.o. [id number] here today for follow up for laparoscopic bilateral tubal ligation on 07/20/20. She is doing well, no issues. Minimal pain after procedure, no issues with bleeding, bathroom use, eating. Overall, very satisfied.    Past Medical History:  Diagnosis Date  . MDD (major depressive disorder)     Past Surgical History:  Procedure Laterality Date  . IUD REMOVAL N/A 07/20/2020   Procedure: INTRAUTERINE DEVICE (IUD) REMOVAL;  Surgeon: Conan Bowens, MD;  [facility];  Service: Gynecology;  Laterality: N/A;  . LAPAROSCOPIC BILATERAL SALPINGECTOMY Bilateral 07/20/2020   Procedure: LAPAROSCOPIC BILATERAL SALPINGECTOMY;  Surgeon: Conan Bowens, MD;  Location: [facility];  Service: Gynecology;  Laterality: Bilateral;  . LAPAROSCOPIC TUBAL LIGATION Bilateral 07/20/2020   Procedure: LAPAROSCOPIC TUBAL LIGATION;  Surgeon: Conan Bowens, MD;  Location: [facility];  Service: Gynecology;  Laterality: Bilateral;  . NO PAST SURGERIES       Current Outpatient Medications:  .  ibuprofen (ADVIL) 800 MG tablet, Take 1 tablet (800 mg total) by mouth 3 (three) times daily with meals as needed for headache, moderate pain or cramping. (Patient not taking: Reported on 08/18/2020), Disp: 30 tablet, Rfl: 1 .  oxyCODONE (ROXICODONE) 5 MG immediate release tablet, Take 1 tablet (5 mg total) by mouth every 4 (four) hours as needed for severe pain. (Patient not taking: Reported on 08/18/2020), Disp: 30 tablet, Rfl: 0 .  sertraline (ZOLOFT) 25 MG tablet, Take 1 tablet (25 mg total) by mouth daily. (Patient not taking: Reported on 08/18/2020), Disp: 30 tablet, Rfl: 5  The following portions of the patient's history were reviewed and updated as appropriate: allergies, current medications, past family history, past medical history, past social history, past surgical history and problem list.   Review of Systems:   Pertinent items noted in HPI and remainder of comprehensive ROS otherwise negative.   Objective:  Physical Exam BP 112/72   Pulse 71   Wt 122 lb (55.3 kg)   BMI 19.69 kg/m  CONSTITUTIONAL: Well-developed, well-nourished female in no acute distress.  HENT:  Normocephalic, atraumatic. External right and left ear normal. Oropharynx is clear and moist EYES: Conjunctivae and EOM are normal. Pupils are equal, round, and reactive to light. No scleral icterus.  NECK: Normal range of motion, supple, no masses SKIN: Skin is warm and dry. No rash noted. Not diaphoretic. No erythema. No pallor. NEUROLOGIC: Alert and oriented to person, place, and time. Normal reflexes, muscle tone coordination. No cranial nerve deficit noted. PSYCHIATRIC: Normal mood and affect. Normal behavior. Normal judgment and thought content. CARDIOVASCULAR: Normal heart rate noted RESPIRATORY: Effort normal, no problems with respiration noted ABDOMEN: Soft, no distention noted.  Incisions all healing well, suture cut at umbilical incision PELVIC: deferred MUSCULOSKELETAL: Normal range of motion. No edema noted.  Labs and Imaging No results found.  Assessment & Plan:   1. Postoperative state Doing well No issues Reviewed benign path Reminded no contraception is 100% effective, take pregnancy test if ever concerned about pregnancy  2. H/O bilateral salpingectomy    Routine preventative health maintenance measures emphasized. Please refer to After Visit Summary for other counseling recommendations.   Return if symptoms worsen or fail to improve.  Total face-to-face time with patient: 15 minutes. Over 50% of encounter was spent on counseling and coordination of care.  Baldemar Lenis, M.D. Attending [facility] for [employer] [facility])

## 2020-08-18 NOTE — Progress Notes (Signed)
Pt is in office for post op Tubal visit. Pt states no problems today. Denies pain or bleeding.  Would like to discuss return of cycles and regulation.

## 2020-11-10 ENCOUNTER — Encounter: Payer: Self-pay | Admitting: General Practice

## 2021-03-29 ENCOUNTER — Other Ambulatory Visit: Payer: Self-pay

## 2021-04-07 ENCOUNTER — Ambulatory Visit (INDEPENDENT_AMBULATORY_CARE_PROVIDER_SITE_OTHER): Payer: Self-pay | Admitting: Obstetrics

## 2021-04-07 ENCOUNTER — Other Ambulatory Visit: Payer: Self-pay

## 2021-04-07 ENCOUNTER — Encounter: Payer: Self-pay | Admitting: Obstetrics

## 2021-04-07 VITALS — BP 109/70 | HR 69 | Ht 66.0 in | Wt 118.3 lb

## 2021-04-07 DIAGNOSIS — N911 Secondary amenorrhea: Secondary | ICD-10-CM

## 2021-04-07 DIAGNOSIS — Z9079 Acquired absence of other genital organ(s): Secondary | ICD-10-CM

## 2021-04-07 LAB — POCT URINE PREGNANCY: Preg Test, Ur: NEGATIVE

## 2021-04-07 NOTE — Progress Notes (Signed)
Patient presents for follow up from bilateral salpingectomy. She states that she has not had a period since surgery. She states that she does get period like sx but no bleeding.

## 2021-04-07 NOTE — Progress Notes (Signed)
Patient ID: Mckenzie [name], female   DOB: Apr 06, 1981, 39 y.o.   MRN: [id number]  Chief Complaint  Patient presents with  . Follow-up    HPI Mckenzie [name] is a 40 y.o. female.  No period since bilateral salpingectomy July 20, 2020.  She states that she has the subjective signs that the period is coming but no bleeding. HPI  Past Medical History:  Diagnosis Date  . MDD (major depressive disorder)     Past Surgical History:  Procedure Laterality Date  . IUD REMOVAL N/A 07/20/2020   Procedure: INTRAUTERINE DEVICE (IUD) REMOVAL;  Surgeon: Conan Bowens, MD;  Location: [facility];  Service: Gynecology;  Laterality: N/A;  . LAPAROSCOPIC BILATERAL SALPINGECTOMY Bilateral 07/20/2020   Procedure: LAPAROSCOPIC BILATERAL SALPINGECTOMY;  Surgeon: Conan Bowens, MD;  Location: [facility];  Service: Gynecology;  Laterality: Bilateral;  . LAPAROSCOPIC TUBAL LIGATION Bilateral 07/20/2020   Procedure: LAPAROSCOPIC TUBAL LIGATION;  Surgeon: Conan Bowens, MD;  Location: [facility];  Service: Gynecology;  Laterality: Bilateral;  . NO PAST SURGERIES      History reviewed. No pertinent family history.  Social History Social History   Tobacco Use  . Smoking status: Current Every Day Smoker    Packs/day: 0.50    Types: Cigarettes    Last attempt to quit: 05/22/2015    Years since quitting: 5.8  . Smokeless tobacco: Never Used  Vaping Use  . Vaping Use: Never used  Substance Use Topics  . Alcohol use: Yes    Alcohol/week: 0.0 standard drinks    Comment: socially  . Drug use: No    No Known Allergies  No current outpatient medications on file.   No current facility-administered medications for this visit.    Review of Systems Review of Systems Constitutional: negative for fatigue and weight loss Respiratory: negative for cough and wheezing Cardiovascular: negative for chest pain, fatigue and palpitations Gastrointestinal: negative  for abdominal pain and change in bowel habits Genitourinary: positive for no period since bilateral salpingectomy  Integument/breast: negative for nipple discharge Musculoskeletal:negative for myalgias Neurological: negative for gait problems and tremors Behavioral/Psych: negative for abusive relationship, depression Endocrine: negative for temperature intolerance      Blood pressure 109/70, pulse 69, height 5\' 6"  (1.676 m), weight 118 lb 4.8 oz (53.7 kg).  Physical Exam Physical Exam General:   alert and no distress  Skin:   no rash or abnormalities  Lungs:   clear to auscultation bilaterally  Heart:   regular rate and rhythm, S1, S2 normal, no murmur, click, rub or gallop  Breasts:   not examined  Abdomen:  normal findings: no organomegaly, soft, non-tender and no hernia   Pelvic exam deferred.  I have spent a total of 15 minutes of face-to-face time, excluding clinical staff time, reviewing notes and preparing to see patient, ordering tests and/or medications, and counseling the patient.  Data Reviewed UPT:    Assessment     1. Secondary amenorrhea Rx: - POCT urine pregnancy:  Negative  2. Status post bilateral salpingectomy     Plan   Follow up in 3 months  , MD 04/07/2021 4:41 PM

## 2021-05-20 ENCOUNTER — Encounter: Payer: Self-pay | Admitting: Obstetrics

## 2021-05-20 ENCOUNTER — Ambulatory Visit (INDEPENDENT_AMBULATORY_CARE_PROVIDER_SITE_OTHER): Payer: Medicaid Other | Admitting: Obstetrics

## 2021-05-20 ENCOUNTER — Other Ambulatory Visit (HOSPITAL_COMMUNITY)
Admission: RE | Admit: 2021-05-20 | Discharge: 2021-05-20 | Disposition: A | Discharge status: Home / Self Care | Payer: Medicaid Other | Source: Ambulatory Visit | Attending: Obstetrics | Admitting: Obstetrics

## 2021-05-20 ENCOUNTER — Other Ambulatory Visit: Payer: Self-pay

## 2021-05-20 VITALS — BP 116/75 | HR 58 | Ht 66.0 in | Wt 121.1 lb

## 2021-05-20 DIAGNOSIS — Z01419 Encounter for gynecological examination (general) (routine) without abnormal findings: Secondary | ICD-10-CM | POA: Diagnosis not present

## 2021-05-20 DIAGNOSIS — F17200 Nicotine dependence, unspecified, uncomplicated: Secondary | ICD-10-CM

## 2021-05-20 DIAGNOSIS — Z113 Encounter for screening for infections with a predominantly sexual mode of transmission: Secondary | ICD-10-CM

## 2021-05-20 DIAGNOSIS — N898 Other specified noninflammatory disorders of vagina: Secondary | ICD-10-CM

## 2021-05-20 DIAGNOSIS — Z1239 Encounter for other screening for malignant neoplasm of breast: Secondary | ICD-10-CM

## 2021-05-20 NOTE — Progress Notes (Signed)
Subjective:        Mckenzie [name] is a 40 y.o. female here for a routine exam.  Current complaints: Vaginal discharge.    Personal health questionnaire:  Is patient [name], have a family history of breast and/or ovarian cancer: no Is there a family history of uterine cancer diagnosed at age < 88, gastrointestinal cancer, urinary tract cancer, family member who is a Personnel officer syndrome-associated carrier: no Is the patient overweight and hypertensive, family history of diabetes, personal history of gestational diabetes, preeclampsia or PCOS: no Is patient over 70, have PCOS,  family history of premature CHD under age 44, diabetes, smoke, have hypertension or peripheral artery disease:  no At any time, has a partner hit, kicked or otherwise hurt or frightened you?: no Over the past 2 weeks, have you felt down, depressed or hopeless?: no Over the past 2 weeks, have you felt little interest or pleasure in doing things?:no   Gynecologic History No LMP recorded. (Menstrual status: Other). Contraception: tubal ligation Last Pap: 2021. Results were: normal Last mammogram: n/a. Results were: n/a  Obstetric History OB History  Gravida Para Term Preterm AB Living  5 5 5     5   SAB IAB Ectopic Multiple Live Births          5    # Outcome Date GA Lbr Len/2nd Weight Sex Delivery Anes PTL Lv  5 Term 09/11/14 [redacted]w[redacted]d 20:01 / 00:09 5 lb 9.4 oz (2.535 kg) M Vag-Spont EPI  LIV  4 Term 12/26/08    M Vag-Spont EPI  LIV  3 Term 10/09/04    M Vag-Spont None  LIV  2 Term 04/22/03    04/24/03 Vag-Spont EPI  LIV  1 Term 10/15/99    F Vag-Spont EPI  LIV    Past Medical History:  Diagnosis Date   MDD (major depressive disorder)     Past Surgical History:  Procedure Laterality Date   IUD REMOVAL N/A 07/20/2020   Procedure: INTRAUTERINE DEVICE (IUD) REMOVAL;  Surgeon: 07/22/2020, MD;  Location: [facility];  Service: Gynecology;  Laterality: N/A;   LAPAROSCOPIC BILATERAL  SALPINGECTOMY Bilateral 07/20/2020   Procedure: LAPAROSCOPIC BILATERAL SALPINGECTOMY;  Surgeon: 07/22/2020, MD;  Location: [facility];  Service: Gynecology;  Laterality: Bilateral;   LAPAROSCOPIC TUBAL LIGATION Bilateral 07/20/2020   Procedure: LAPAROSCOPIC TUBAL LIGATION;  Surgeon: 07/22/2020, MD;  Location: [facility];  Service: Gynecology;  Laterality: Bilateral;   NO PAST SURGERIES      No current outpatient medications on file. No Known Allergies  Social History   Tobacco Use   Smoking status: Every Day    Packs/day: 0.50    Pack years: 0.00    Types: Cigarettes    Last attempt to quit: 05/22/2015    Years since quitting: 6.0   Smokeless tobacco: Never  Substance Use Topics   Alcohol use: Yes    Alcohol/week: 0.0 standard drinks    Comment: socially    History reviewed. No pertinent family history.    Review of Systems  Constitutional: negative for fatigue and weight loss Respiratory: negative for cough and wheezing Cardiovascular: negative for chest pain, fatigue and palpitations Gastrointestinal: negative for abdominal pain and change in bowel habits Musculoskeletal:negative for myalgias Neurological: negative for gait problems and tremors Behavioral/Psych: negative for abusive relationship, depression Endocrine: negative for temperature intolerance    Genitourinary:negative for abnormal menstrual periods, genital lesions, hot flashes, sexual problems and vaginal  discharge Integument/breast: negative for breast lump, breast tenderness, nipple discharge and skin lesion(s)    Objective:       BP 116/75   Pulse (!) 58   Ht 5\' 6"  (1.676 m)   Wt 121 lb 1.6 oz (54.9 kg)   BMI 19.55 kg/m  General:   Alert and no distress  Skin:   no rash or abnormalities  Lungs:   clear to auscultation bilaterally  Heart:   regular rate and rhythm, S1, S2 normal, no murmur, click, rub or gallop  Breasts:   normal without suspicious masses, skin  or nipple changes or axillary nodes  Abdomen:  normal findings: no organomegaly, soft, non-tender and no hernia  Pelvis:  External genitalia: normal general appearance Urinary system: urethral meatus normal and bladder without fullness, nontender Vaginal: normal without tenderness, induration or masses Cervix: normal appearance Adnexa: normal bimanual exam Uterus: anteverted and non-tender, normal size   Lab Review Urine pregnancy test Labs reviewed yes Radiologic studies reviewed no    Assessment:     1. Encounter for gynecological examination with Papanicolaou smear of cervix Rx: - Cytology - PAP( [place])  2. Vaginal discharge Rx: - Cervicovaginal ancillary only( [place])  3. Screening for STD (sexually transmitted disease) Rx: - RPR+HBsAg+HCVAb+...  4. Screening breast examination Rx: - MM Digital Screening; Future  5. Tobacco dependence - cessation with the aid of medication and behavioral modification recommended    Plan:    Education reviewed: calcium supplements, depression evaluation, low fat, low cholesterol diet, safe sex/STD prevention, self breast exams, smoking cessation, and weight bearing exercise. Mammogram ordered. Follow up in: 1 year.    Orders Placed This Encounter  Procedures   MM Digital Screening    Standing Status:   Future    Standing Expiration Date:   05/20/2022    Order Specific Question:   Reason for Exam (SYMPTOM  OR DIAGNOSIS REQUIRED)    Answer:   screening    Order Specific Question:   Is the patient pregnant?    Answer:   No    Order Specific Question:   Preferred imaging location?    Answer:   [facility]   RPR+HBsAg+HCVAb+...     05/22/2022, MD 05/20/2021 9:43 AM

## 2021-05-20 NOTE — Progress Notes (Signed)
Annual exam. No concerns. Wants testing for STDs, swab and labs.

## 2021-05-21 LAB — RPR+HBSAG+HCVAB+...
HIV Screen 4th Generation wRfx: NONREACTIVE
Hep C Virus Ab: <0.1 s/co ratio (ref 0.0–0.9)
Hepatitis B Surface Ag: NEGATIVE
RPR Ser Ql: NONREACTIVE

## 2021-05-24 ENCOUNTER — Other Ambulatory Visit: Payer: Self-pay | Admitting: Obstetrics

## 2021-05-24 DIAGNOSIS — B9689 Other specified bacterial agents as the cause of diseases classified elsewhere: Secondary | ICD-10-CM

## 2021-05-24 DIAGNOSIS — N760 Acute vaginitis: Secondary | ICD-10-CM

## 2021-05-24 LAB — CYTOLOGY - PAP
Comment: NEGATIVE
Diagnosis: NEGATIVE
Diagnosis: REACTIVE
High risk HPV: NEGATIVE

## 2021-05-24 LAB — CERVICOVAGINAL ANCILLARY ONLY
Bacterial Vaginitis (gardnerella): POSITIVE — AB
Candida Glabrata: NEGATIVE
Candida Vaginitis: NEGATIVE
Chlamydia: NEGATIVE
Comment: NEGATIVE
Comment: NEGATIVE
Comment: NEGATIVE
Comment: NEGATIVE
Comment: NEGATIVE
Comment: NORMAL
Neisseria Gonorrhea: NEGATIVE
Trichomonas: NEGATIVE

## 2021-05-24 MED ORDER — METRONIDAZOLE 500 MG PO TABS: 500.0000 mg | ORAL_TABLET | Freq: Two times a day (BID) | ORAL | 2 refills | Status: DC

## 2021-05-31 ENCOUNTER — Ambulatory Visit (HOSPITAL_BASED_OUTPATIENT_CLINIC_OR_DEPARTMENT_OTHER): Payer: Medicaid Other | Admitting: Radiology

## 2021-06-01 ENCOUNTER — Ambulatory Visit (HOSPITAL_BASED_OUTPATIENT_CLINIC_OR_DEPARTMENT_OTHER): Admission: RE | Admit: 2021-06-01 | Payer: Medicaid Other | Source: Ambulatory Visit | Admitting: Radiology

## 2021-06-08 ENCOUNTER — Ambulatory Visit (HOSPITAL_BASED_OUTPATIENT_CLINIC_OR_DEPARTMENT_OTHER)
Admission: RE | Admit: 2021-06-08 | Discharge: 2021-06-08 | Disposition: A | Discharge status: Home / Self Care | Payer: Medicaid Other | Source: Ambulatory Visit | Attending: Obstetrics | Admitting: Obstetrics

## 2021-06-08 ENCOUNTER — Other Ambulatory Visit: Payer: Self-pay

## 2021-06-08 DIAGNOSIS — Z1239 Encounter for other screening for malignant neoplasm of breast: Secondary | ICD-10-CM

## 2021-06-08 DIAGNOSIS — Z1231 Encounter for screening mammogram for malignant neoplasm of breast: Secondary | ICD-10-CM | POA: Insufficient documentation

## 2021-06-14 ENCOUNTER — Other Ambulatory Visit: Payer: Self-pay

## 2021-06-14 MED ORDER — METRONIDAZOLE 0.75 % VA GEL: 1.0000 | Freq: Two times a day (BID) | VAGINAL | 0 refills | Status: DC

## 2021-07-06 ENCOUNTER — Ambulatory Visit (HOSPITAL_BASED_OUTPATIENT_CLINIC_OR_DEPARTMENT_OTHER): Payer: Medicaid Other | Admitting: Radiology

## 2022-01-29 ENCOUNTER — Encounter (HOSPITAL_COMMUNITY): Payer: Self-pay | Admitting: Emergency Medicine

## 2022-01-29 ENCOUNTER — Emergency Department (HOSPITAL_COMMUNITY): Payer: Medicaid Other

## 2022-01-29 ENCOUNTER — Other Ambulatory Visit: Payer: Self-pay

## 2022-01-29 ENCOUNTER — Emergency Department (HOSPITAL_COMMUNITY)
Admission: EM | Admit: 2022-01-29 | Discharge: 2022-01-29 | Disposition: A | Discharge status: Home / Self Care | Payer: Medicaid Other | Attending: Student | Admitting: Student

## 2022-01-29 DIAGNOSIS — S9032XA Contusion of left foot, initial encounter: Secondary | ICD-10-CM

## 2022-01-29 DIAGNOSIS — W208XXA Other cause of strike by thrown, projected or falling object, initial encounter: Secondary | ICD-10-CM | POA: Insufficient documentation

## 2022-01-29 DIAGNOSIS — M79672 Pain in left foot: Secondary | ICD-10-CM | POA: Insufficient documentation

## 2022-01-29 DIAGNOSIS — I10 Essential (primary) hypertension: Secondary | ICD-10-CM | POA: Insufficient documentation

## 2022-01-29 DIAGNOSIS — Y990 Civilian activity done for income or pay: Secondary | ICD-10-CM | POA: Insufficient documentation

## 2022-01-29 DIAGNOSIS — F17210 Nicotine dependence, cigarettes, uncomplicated: Secondary | ICD-10-CM | POA: Insufficient documentation

## 2022-01-29 MED ORDER — NAPROXEN 250 MG PO TABS
500.0000 mg | ORAL_TABLET | Freq: Once | ORAL | Status: AC
  Administered 2022-01-29: 500 mg via ORAL
  Filled 2022-01-29: qty 2

## 2022-01-29 MED ORDER — NAPROXEN 375 MG PO TABS: 375.0000 mg | ORAL_TABLET | Freq: Two times a day (BID) | ORAL | 0 refills | Status: DC

## 2022-01-29 NOTE — ED Provider Notes (Signed)
[facility] EMERGENCY DEPARTMENT Provider Note  CSN: [id number] Arrival date & time: 01/29/22 0944  Chief Complaint(s) Foot Pain  HPI Mckenzie [name] is a 41 y.o. female who presents emergency department for evaluation of left foot pain.  Patient states that she was at work at approximately 10 PM when a heavy object fell on the dorsum of her foot.  She states she did not have significant pain when it happened but she awoke this morning with 10 out of 10 pain and inability to walk.  She arrives with palpable 2+ pulses, a small palpable tender nodule over the dorsum of the foot.  No tenderness at the ankle or above.  No numbness, tingling, weakness.   Foot Pain   Past Medical History Past Medical History:  Diagnosis Date   MDD (major depressive disorder)    Patient Active Problem List   Diagnosis Date Noted   BV (bacterial vaginosis) 11/17/2014   Benign essential hypertension with delivery 10/06/2014   Hypertonic, incoordinate, and prolonged uterine contractions 09/11/2014   Fetal heart deceleration 09/11/2014   NVD (normal vaginal delivery) 09/11/2014   Supervision of other normal pregnancy 03/27/2014   Smoker 03/27/2014   Home Medication(s) Prior to Admission medications   Medication Sig Start Date End Date Taking? Authorizing Provider  metroNIDAZOLE (FLAGYL) 500 MG tablet Take 1 tablet (500 mg total) by mouth 2 (two) times daily. 05/24/21   Brock Bad, MD  metroNIDAZOLE (METROGEL VAGINAL) 0.75 % vaginal gel Place 1 Applicatorful vaginally 2 (two) times daily. 06/14/21   Brock Bad, MD                                                                                                                                    Past Surgical History Past Surgical History:  Procedure Laterality Date   IUD REMOVAL N/A 07/20/2020   Procedure: INTRAUTERINE DEVICE (IUD) REMOVAL;  Surgeon: Conan Bowens, MD;  Location: [facility];  Service:  Gynecology;  Laterality: N/A;   LAPAROSCOPIC BILATERAL SALPINGECTOMY Bilateral 07/20/2020   Procedure: LAPAROSCOPIC BILATERAL SALPINGECTOMY;  Surgeon: Conan Bowens, MD;  Location: [facility];  Service: Gynecology;  Laterality: Bilateral;   LAPAROSCOPIC TUBAL LIGATION Bilateral 07/20/2020   Procedure: LAPAROSCOPIC TUBAL LIGATION;  Surgeon: Conan Bowens, MD;  Location: [facility];  Service: Gynecology;  Laterality: Bilateral;   NO PAST SURGERIES     Family History No family history on file.  Social History Social History   Tobacco Use   Smoking status: Every Day    Packs/day: 0.50    Types: Cigarettes    Last attempt to quit: 05/22/2015    Years since quitting: 6.6   Smokeless tobacco: Never  Vaping Use   Vaping Use: Never used  Substance Use Topics   Alcohol use: Yes    Alcohol/week: 0.0 standard drinks    Comment: socially   Drug  use: No   Allergies Patient has no known allergies.  Review of Systems Review of Systems  Musculoskeletal:        Foot pain   Physical Exam Vital Signs  I have reviewed the triage vital signs BP (!) 135/98    Pulse 61    Temp 98.3 F (36.8 C)    Resp 18    Ht 5\' 6"  (1.676 m)    Wt 54.4 kg    SpO2 100%    BMI 19.37 kg/m   Physical Exam Vitals and nursing note reviewed.  Constitutional:      General: She is not in acute distress.    Appearance: She is well-developed.  HENT:     Head: Normocephalic and atraumatic.  Eyes:     Conjunctiva/sclera: Conjunctivae normal.  Cardiovascular:     Rate and Rhythm: Normal rate and regular rhythm.     Heart sounds: No murmur heard. Pulmonary:     Effort: Pulmonary effort is normal. No respiratory distress.     Breath sounds: Normal breath sounds.  Abdominal:     Palpations: Abdomen is soft.     Tenderness: There is no abdominal tenderness.  Musculoskeletal:        General: Tenderness (Dorsum of left foot) present. No swelling.     Cervical back: Neck supple.   Skin:    General: Skin is warm and dry.     Capillary Refill: Capillary refill takes less than 2 seconds.  Neurological:     Mental Status: She is alert.  Psychiatric:        Mood and Affect: Mood normal.    ED Results and Treatments Labs (all labs ordered are listed, but only abnormal results are displayed) Labs Reviewed - No data to display                                                                                                                        Radiology No results found.  Pertinent labs & imaging results that were available during my care of the patient were reviewed by me and considered in my medical decision making (see MDM for details).  Medications Ordered in ED Medications - No data to display                                                                                                                                   Procedures Procedures  (  including critical care time)  Medical Decision Making / ED Course   This patient presents to the ED for concern of foot pain, this involves an extensive number of treatment options, and is a complaint that carries with it a high risk of complications and morbidity.  The differential diagnosis includes fracture, hematoma, contusion, ligamentous injury  MDM: Patient seen emergency department for evaluation of left foot pain.  Physical exam with a tender nodule over the dorsum of the left foot but is otherwise unremarkable.  Pulses intact.  X-ray negative for fracture.  Patient given Naprosyn for pain and crutches for ambulation.  She was instructed to resume weightbearing as tolerated.  Patient structured to follow-up with her primary care physician and discharged on Naprosyn.   Additional history obtained:  -External records from outside source obtained and reviewed including: Chart review including previous notes, labs, imaging, consultation notes   Lab Tests: -I ordered, reviewed, and interpreted labs.   The  pertinent results include:   Labs Reviewed - No data to display     Imaging Studies ordered: I ordered imaging studies including foot XR I independently visualized and interpreted imaging. I agree with the radiologist interpretation   Medicines ordered and prescription drug management: No orders of the defined types were placed in this encounter.   -I have reviewed the patients home medicines and have made adjustments as needed  Critical interventions none Social Determinants of Health:  Factors impacting patients care include: none   Reevaluation: After the interventions noted above, I reevaluated the patient and found that they have :improved  Co morbidities that complicate the patient evaluation  Past Medical History:  Diagnosis Date   MDD (major depressive disorder)       Dispostion: I considered admission for this patient, but she has negative traumatic imaging and does not meet inpatient criteria and is safe for outpatient follow-up.     Final Clinical Impression(s) / ED Diagnoses Final diagnoses:  None     @PCDICTATION @    Glendora Score, MD 01/29/22 [phone]

## 2022-01-29 NOTE — ED Notes (Signed)
Pt d/c home per MD order. Discharge summary reviewed, pt verbalizes understanding. Off unit via WC- Crutches provided per order. No s/s of acute distress noted at discharge

## 2022-01-29 NOTE — ED Triage Notes (Signed)
C/o L foot pain.  States a barrel fell on foot at work yesterday.

## 2022-10-17 ENCOUNTER — Encounter: Payer: Self-pay | Admitting: Obstetrics

## 2022-10-17 ENCOUNTER — Ambulatory Visit (INDEPENDENT_AMBULATORY_CARE_PROVIDER_SITE_OTHER): Payer: Commercial Managed Care - HMO | Admitting: Obstetrics

## 2022-10-17 ENCOUNTER — Other Ambulatory Visit (HOSPITAL_COMMUNITY)
Admission: RE | Admit: 2022-10-17 | Discharge: 2022-10-17 | Disposition: A | Discharge status: Home / Self Care | Payer: Commercial Managed Care - HMO | Source: Ambulatory Visit | Attending: Obstetrics | Admitting: Obstetrics

## 2022-10-17 VITALS — BP 124/79 | HR 58 | Ht 67.0 in | Wt 114.0 lb

## 2022-10-17 DIAGNOSIS — N898 Other specified noninflammatory disorders of vagina: Secondary | ICD-10-CM | POA: Diagnosis present

## 2022-10-17 DIAGNOSIS — Z113 Encounter for screening for infections with a predominantly sexual mode of transmission: Secondary | ICD-10-CM

## 2022-10-17 DIAGNOSIS — Z1239 Encounter for other screening for malignant neoplasm of breast: Secondary | ICD-10-CM | POA: Diagnosis not present

## 2022-10-17 DIAGNOSIS — Z01419 Encounter for gynecological examination (general) (routine) without abnormal findings: Secondary | ICD-10-CM | POA: Insufficient documentation

## 2022-10-17 NOTE — Progress Notes (Signed)
Subjective:        Mckenzie [name] is a 41 y.o. female here for a routine exam.  Current complaints: Vaginal discharge.    Personal health questionnaire:  Is patient [name], have a family history of breast and/or ovarian cancer: no Is there a family history of uterine cancer diagnosed at age < 93, gastrointestinal cancer, urinary tract cancer, family member who is a Personnel officer syndrome-associated carrier: no Is the patient overweight and hypertensive, family history of diabetes, personal history of gestational diabetes, preeclampsia or PCOS: no Is patient over 74, have PCOS,  family history of premature CHD under age 85, diabetes, smoke, have hypertension or peripheral artery disease:  no At any time, has a partner hit, kicked or otherwise hurt or frightened you?: no Over the past 2 weeks, have you felt down, depressed or hopeless?: no Over the past 2 weeks, have you felt little interest or pleasure in doing things?:no   Gynecologic History No LMP recorded. (Menstrual status: Other). Contraception: tubal ligation Last Pap: 2022. Results were: normal Last mammogram: 2022. Results were: normal  Obstetric History OB History  Gravida Para Term Preterm AB Living  5 5 5     5   SAB IAB Ectopic Multiple Live Births          5    # Outcome Date GA Lbr Len/2nd Weight Sex Delivery Anes PTL Lv  5 Term 09/11/14 [redacted]w[redacted]d 20:01 / 00:09 5 lb 9.4 oz (2.535 kg) M Vag-Spont EPI  LIV  4 Term 12/26/08    M Vag-Spont EPI  LIV  3 Term 10/09/04    M Vag-Spont None  LIV  2 Term 04/22/03    M Vag-Spont EPI  LIV  1 Term 10/15/99    F Vag-Spont EPI  LIV    Past Medical History:  Diagnosis Date   Hypertension    MDD (major depressive disorder)    Vaginal Pap smear, abnormal     Past Surgical History:  Procedure Laterality Date   IUD REMOVAL N/A 07/20/2020   Procedure: INTRAUTERINE DEVICE (IUD) REMOVAL;  Surgeon: 07/22/2020, MD;  Location: [facility];  Service: Gynecology;   Laterality: N/A;   LAPAROSCOPIC BILATERAL SALPINGECTOMY Bilateral 07/20/2020   Procedure: LAPAROSCOPIC BILATERAL SALPINGECTOMY;  Surgeon: 07/22/2020, MD;  Location: [facility];  Service: Gynecology;  Laterality: Bilateral;   LAPAROSCOPIC TUBAL LIGATION Bilateral 07/20/2020   Procedure: LAPAROSCOPIC TUBAL LIGATION;  Surgeon: 07/22/2020, MD;  Location: [facility];  Service: Gynecology;  Laterality: Bilateral;   TUBAL LIGATION       Current Outpatient Medications:    metroNIDAZOLE (FLAGYL) 500 MG tablet, Take 1 tablet (500 mg total) by mouth 2 (two) times daily. (Patient not taking: Reported on 10/17/2022), Disp: 14 tablet, Rfl: 2   metroNIDAZOLE (METROGEL VAGINAL) 0.75 % vaginal gel, Place 1 Applicatorful vaginally 2 (two) times daily. (Patient not taking: Reported on 10/17/2022), Disp: 70 g, Rfl: 0   naproxen (NAPROSYN) 375 MG tablet, Take 1 tablet (375 mg total) by mouth 2 (two) times daily. (Patient not taking: Reported on 10/17/2022), Disp: 20 tablet, Rfl: 0 No Known Allergies  Social History   Tobacco Use   Smoking status: Former    Packs/day: 0.50    Types: Cigarettes    Quit date: 05/22/2015    Years since quitting: 7.4   Smokeless tobacco: Current  Substance Use Topics   Alcohol use: Yes    Alcohol/week: 0.0 standard drinks of  alcohol    Comment: socially    History reviewed. No pertinent family history.    Review of Systems  Constitutional: negative for fatigue and weight loss Respiratory: negative for cough and wheezing Cardiovascular: negative for chest pain, fatigue and palpitations Gastrointestinal: negative for abdominal pain and change in bowel habits Musculoskeletal:negative for myalgias Neurological: negative for gait problems and tremors Behavioral/Psych: negative for abusive relationship, depression Endocrine: negative for temperature intolerance    Genitourinary:positive for vaginal discharge.  negative for abnormal  menstrual periods, genital lesions, hot flashes, sexual problems  Integument/breast: negative for breast lump, breast tenderness, nipple discharge and skin lesion(s)    Objective:       BP 124/79   Pulse (!) 58   Ht 5\' 7"  (1.702 m)   Wt 114 lb (51.7 kg)   BMI 17.85 kg/m  General:   alert  Skin:   no rash or abnormalities  Lungs:   clear to auscultation bilaterally  Heart:   regular rate and rhythm, S1, S2 normal, no murmur, click, rub or gallop  Breasts:   normal without suspicious masses, skin or nipple changes or axillary nodes  Abdomen:  normal findings: no organomegaly, soft, non-tender and no hernia  Pelvis:  External genitalia: normal general appearance Urinary system: urethral meatus normal and bladder without fullness, nontender Vaginal: normal without tenderness, induration or masses Cervix: normal appearance Adnexa: normal bimanual exam Uterus: anteverted and non-tender, normal size   Lab Review Urine pregnancy test Labs reviewed yes Radiologic studies reviewed yes  I have spent a total of 20 minutes of face-to-face time, excluding clinical staff time, reviewing notes and preparing to see patient, ordering tests and/or medications, and counseling the patient.   Assessment:    1. Women's annual routine gynecological examination [Z01.419] Rx: - Cytology - PAP( [place])  2. Vaginal discharge Rx: - Cervicovaginal ancillary only( )  3. Screen for STD (sexually transmitted disease) Rx: - RPR - HIV antibody (with reflex) - Hepatitis C Antibody - Hepatitis B Surface AntiGEN  4. Screening breast examination Rx: - MM 3D SCREEN BREAST BILATERAL; Future      Plan:    Education reviewed: calcium supplements, depression evaluation, low fat, low cholesterol diet, safe sex/STD prevention, self breast exams, and weight bearing exercise. Mammogram ordered. Follow up in: 1 year.    Orders Placed This Encounter  Procedures   MM 3D SCREEN BREAST  BILATERAL    MCD ANNUAL/ 06/08/21 [facility] NO PROBLEMS/ NO NEEDS; NO BREAST SXS/ NO HX BR CA PT AWARE OF $75 NO SHOW/CX FEE  RG W/ OFFICE    Standing Status:   Future    Standing Expiration Date:   10/17/2023    Order Specific Question:   Reason for Exam (SYMPTOM  OR DIAGNOSIS REQUIRED)    Answer:   Screening    Order Specific Question:   Is the patient pregnant?    Answer:   No    Order Specific Question:   Preferred imaging location?    Answer:   [facility]   RPR   HIV antibody (with reflex)   Hepatitis C Antibody   Hepatitis B Surface AntiGEN    10/19/2023, MD 10/17/2022 11:37 AM

## 2022-10-17 NOTE — Progress Notes (Addendum)
41 y.o GYN presents for AEX/PAP/STD screening. Last Mammogram 06/08/2021

## 2022-10-18 LAB — CERVICOVAGINAL ANCILLARY ONLY
Bacterial Vaginitis (gardnerella): POSITIVE — AB
Chlamydia: NEGATIVE
Comment: NEGATIVE
Comment: NEGATIVE
Comment: NEGATIVE
Comment: NORMAL
Neisseria Gonorrhea: NEGATIVE
Trichomonas: NEGATIVE

## 2022-10-18 LAB — HIV ANTIBODY (ROUTINE TESTING W REFLEX): HIV Screen 4th Generation wRfx: NONREACTIVE

## 2022-10-18 LAB — HEPATITIS B SURFACE ANTIGEN: Hepatitis B Surface Ag: NEGATIVE

## 2022-10-18 LAB — RPR: RPR Ser Ql: NONREACTIVE

## 2022-10-18 LAB — HEPATITIS C ANTIBODY: Hep C Virus Ab: NONREACTIVE

## 2022-10-19 ENCOUNTER — Telehealth: Payer: Self-pay | Admitting: Emergency Medicine

## 2022-10-19 ENCOUNTER — Other Ambulatory Visit: Payer: Self-pay | Admitting: Obstetrics

## 2022-10-19 DIAGNOSIS — N760 Acute vaginitis: Secondary | ICD-10-CM

## 2022-10-19 LAB — CYTOLOGY - PAP
Comment: NEGATIVE
Diagnosis: NEGATIVE
High risk HPV: NEGATIVE

## 2022-10-19 MED ORDER — METRONIDAZOLE 500 MG PO TABS: 500.0000 mg | ORAL_TABLET | Freq: Two times a day (BID) | ORAL | 2 refills | Status: DC

## 2022-10-19 NOTE — Telephone Encounter (Signed)
TC to patient to inform of results. Pt reports that she is not having symptoms, so she will hold on taking medication.

## 2022-10-19 NOTE — Telephone Encounter (Signed)
-----   Message from Brock Bad, MD sent at 10/19/2022  8:17 AM EST ----- Flagyl Rx for BV.

## 2022-10-31 ENCOUNTER — Ambulatory Visit: Payer: Commercial Managed Care - HMO

## 2022-12-27 ENCOUNTER — Ambulatory Visit: Payer: Commercial Managed Care - HMO

## 2022-12-29 ENCOUNTER — Ambulatory Visit: Payer: Medicaid Other | Admitting: Family Medicine

## 2023-02-01 ENCOUNTER — Ambulatory Visit (HOSPITAL_COMMUNITY)
Admission: EM | Admit: 2023-02-01 | Discharge: 2023-02-01 | Disposition: A | Discharge status: Home / Self Care | Payer: Commercial Managed Care - HMO | Attending: Emergency Medicine | Admitting: Emergency Medicine

## 2023-02-01 ENCOUNTER — Encounter (HOSPITAL_COMMUNITY): Payer: Self-pay

## 2023-02-01 DIAGNOSIS — K047 Periapical abscess without sinus: Secondary | ICD-10-CM

## 2023-02-01 DIAGNOSIS — K05219 Aggressive periodontitis, localized, unspecified severity: Secondary | ICD-10-CM

## 2023-02-01 DIAGNOSIS — K0889 Other specified disorders of teeth and supporting structures: Secondary | ICD-10-CM

## 2023-02-01 DIAGNOSIS — Z5321 Procedure and treatment not carried out due to patient leaving prior to being seen by health care provider: Secondary | ICD-10-CM

## 2023-02-01 MED ORDER — AMOXICILLIN-POT CLAVULANATE 875-125 MG PO TABS: 1.0000 | ORAL_TABLET | Freq: Two times a day (BID) | ORAL | 0 refills | Status: DC

## 2023-02-01 MED ORDER — HYDROCODONE-ACETAMINOPHEN 5-325 MG PO TABS: 1.0000 | ORAL_TABLET | Freq: Four times a day (QID) | ORAL | 0 refills | Status: DC | PRN

## 2023-02-01 MED ORDER — NAPROXEN 500 MG PO TABS: 500.0000 mg | ORAL_TABLET | Freq: Two times a day (BID) | ORAL | 0 refills | Status: DC

## 2023-02-01 MED ORDER — CHLORHEXIDINE GLUCONATE 0.12 % MT SOLN: OROMUCOSAL | 0 refills | Status: DC

## 2023-02-01 NOTE — ED Provider Notes (Signed)
HPI  SUBJECTIVE:  Mckenzie [name] is a 42 y.o. female who presents with 2 days of left upper dental pain, gingival swelling.  She states that the roof of her mouth is also swollen.  She has a bad tooth in this area, but denies recent trauma to it.  She states that she is unable to sleep or eat because of the pain.  No fevers, facial swelling, trismus.  She has been taking Tylenol, ibuprofen, using cool compresses and warm water rinses.  The cool compresses help.  Symptoms are worse with exposure to air, p.o. intake and with talking.  Last dose of antipyretic was within 6 hours of evaluation.  She has no past medical history.  LMP: 2 years ago.  She denies possibility being pregnant, she is status post bilateral tubal ligation.  Dentist: She has an appointment on Monday with a new dentist    Past Medical History:  Diagnosis Date   Hypertension    MDD (major depressive disorder)    Vaginal Pap smear, abnormal     Past Surgical History:  Procedure Laterality Date   IUD REMOVAL N/A 07/20/2020   Procedure: INTRAUTERINE DEVICE (IUD) REMOVAL;  Surgeon: Sloan Leiter, MD;  Location: [facility];  Service: Gynecology;  Laterality: N/A;   LAPAROSCOPIC BILATERAL SALPINGECTOMY Bilateral 07/20/2020   Procedure: LAPAROSCOPIC BILATERAL SALPINGECTOMY;  Surgeon: Sloan Leiter, MD;  Location: [facility];  Service: Gynecology;  Laterality: Bilateral;   LAPAROSCOPIC TUBAL LIGATION Bilateral 07/20/2020   Procedure: LAPAROSCOPIC TUBAL LIGATION;  Surgeon: Sloan Leiter, MD;  Location: [facility];  Service: Gynecology;  Laterality: Bilateral;   TUBAL LIGATION      History reviewed. No pertinent family history.  Social History   Tobacco Use   Smoking status: Former    Packs/day: 0.50    Types: Cigarettes    Quit date: 05/22/2015    Years since quitting: 7.7   Smokeless tobacco: Current  Vaping Use   Vaping Use: Every day   Substances: Flavoring   Substance Use Topics   Alcohol use: Yes    Alcohol/week: 0.0 standard drinks of alcohol    Comment: socially   Drug use: No    No current facility-administered medications for this encounter.  Current Outpatient Medications:    amoxicillin-clavulanate (AUGMENTIN) 875-125 MG tablet, Take 1 tablet by mouth every 12 (twelve) hours., Disp: 14 tablet, Rfl: 0   chlorhexidine (PERIDEX) 0.12 % solution, 15 mL swish and spit bid, Disp: 480 mL, Rfl: 0   HYDROcodone-acetaminophen (NORCO/VICODIN) 5-325 MG tablet, Take 1-2 tablets by mouth every 6 (six) hours as needed for moderate pain or severe pain., Disp: 12 tablet, Rfl: 0   naproxen (NAPROSYN) 500 MG tablet, Take 1 tablet (500 mg total) by mouth 2 (two) times daily., Disp: 20 tablet, Rfl: 0  No Known Allergies   ROS  As noted in HPI.   Physical Exam  BP 118/84 (BP Location: Right Arm)   Pulse 74   Temp 99.1 F (37.3 C) (Oral)   Resp 14   SpO2 97%   Constitutional: Well developed, well nourished, appears uncomfortable Eyes:  EOMI, conjunctiva normal bilaterally HENT: Normocephalic, atraumatic,mucus membranes moist Positive sensitive tender gingival swelling above left upper cuspid/first bicuspid tooth 11/12.  These teeth are tender to palpation.  Positive mild decay.  Positive swelling along the roof of the mouth.  No trismus.  No facial swelling. Neck: No cervical lymphadenopathy Respiratory: Normal inspiratory effort Cardiovascular: Normal rate GI:  nondistended skin: No rash, skin intact Musculoskeletal: no deformities Neurologic: Alert & oriented x 3, no focal neuro deficits Psychiatric: Speech and behavior appropriate   ED Course   Medications - No data to display  No orders of the defined types were placed in this encounter.   No results found for this or any previous visit (from the past 24 hour(s)). No results found.  ED Clinical Impression  1. Gingival abscess   2. Dental infection      ED  Assessment/Plan     Procedure note: After obtaining verbal consent, had patient hold ice to the area.  Made a single stab incision with a sterile 18-gauge needle and expressed a copious amount of blood and pus with symptomatic relief.  Advised patient to hold pressure on the area.  Patient tolerated procedure well.   [place] Narcotic database reviewed for this patient, and feel that the risk/benefit ratio today is favorable for proceeding with a prescription for controlled substance.  No opiate prescriptions in the past 2 years.  Home with Augmentin for 7 days, Naprosyn and a Tylenol containing product twice a day, either Tylenol or Norco.  Peridex, or Listerine if this is too expensive, salt water rinses.  She has follow-up scheduled with a dentist on Monday.  Discussed  MDM, treatment plan, and plan for follow-up with patient. Discussed sn/sx that should prompt return to the ED. patient agrees with plan.   Meds ordered this encounter  Medications   HYDROcodone-acetaminophen (NORCO/VICODIN) 5-325 MG tablet    Sig: Take 1-2 tablets by mouth every 6 (six) hours as needed for moderate pain or severe pain.    Dispense:  12 tablet    Refill:  0   chlorhexidine (PERIDEX) 0.12 % solution    Sig: 15 mL swish and spit bid    Dispense:  480 mL    Refill:  0   amoxicillin-clavulanate (AUGMENTIN) 875-125 MG tablet    Sig: Take 1 tablet by mouth every 12 (twelve) hours.    Dispense:  14 tablet    Refill:  0   naproxen (NAPROSYN) 500 MG tablet    Sig: Take 1 tablet (500 mg total) by mouth 2 (two) times daily.    Dispense:  20 tablet    Refill:  0      *This clinic note was created using [employer]. Therefore, there may be occasional mistakes despite careful proofreading.  ?    Melynda Ripple, MD 02/02/23 [phone]

## 2023-02-01 NOTE — ED Triage Notes (Signed)
Patient c/o left upper dental pain x 2 days. No facial swelling or difficulty swallowing noted.  Patient states she has had Tylenol ES and Ibuprofen every 4 hours, but none today.

## 2023-02-01 NOTE — Discharge Instructions (Addendum)
Swish Peridex or Listerine after you eat to help irrigate the wound.  Warm salt water rinses can be helpful as well.  Finish the Augmentin, even if you feel better.  Take the Naprosyn and a Tylenol containing product twice a day.  You may take an additional Tylenol containing product 1 more time per day.  Either 1000 mg of Tylenol for mild to moderate pain, 1-2 Norco for severe pain.  Do not take Tylenol and Norco as they both have Tylenol in them and to which Tylenol can hurt your liver.

## 2023-03-21 ENCOUNTER — Encounter: Payer: Self-pay | Admitting: Obstetrics

## 2023-03-21 ENCOUNTER — Ambulatory Visit (INDEPENDENT_AMBULATORY_CARE_PROVIDER_SITE_OTHER): Payer: Medicaid Other | Admitting: Obstetrics

## 2023-03-21 VITALS — BP 116/78 | HR 69 | Ht 67.0 in | Wt 108.0 lb

## 2023-03-21 DIAGNOSIS — N951 Menopausal and female climacteric states: Secondary | ICD-10-CM | POA: Diagnosis not present

## 2023-03-21 DIAGNOSIS — F320 Major depressive disorder, single episode, mild: Secondary | ICD-10-CM

## 2023-03-21 DIAGNOSIS — E230 Hypopituitarism: Secondary | ICD-10-CM

## 2023-03-21 NOTE — Progress Notes (Addendum)
42 y.o GYN presents for menopausal Sx.  C/o hot flashes, low libido, memory loss, insomnia x 2+ years.  She had BTL in 2021.  Last PAP 10/17/2022 NILM  PHQ-9=16

## 2023-03-21 NOTE — Progress Notes (Signed)
Patient ID: Mckenzie [name], female   DOB: 11-04-1981, 42 y.o.   MRN: [id number]  Chief Complaint  Patient presents with   Menopause    HPI Mckenzie [name] is a 42 y.o. female.  Complains of severe ho-t flashes, insomnia, anxiety / depression, decreased libido and memory loss.  Also c/o visual deficits and headaches.  Has not had a period for 2 years, after bilateral salpingectomy done HPI  Past Medical History:  Diagnosis Date   Hypertension    MDD (major depressive disorder)    Vaginal Pap smear, abnormal     Past Surgical History:  Procedure Laterality Date   IUD REMOVAL N/A 07/20/2020   Procedure: INTRAUTERINE DEVICE (IUD) REMOVAL;  Surgeon: Conan Bowens, MD;  Location: [facility];  Service: Gynecology;  Laterality: N/A;   LAPAROSCOPIC BILATERAL SALPINGECTOMY Bilateral 07/20/2020   Procedure: LAPAROSCOPIC BILATERAL SALPINGECTOMY;  Surgeon: Conan Bowens, MD;  [facility];  Service: Gynecology;  Laterality: Bilateral;   LAPAROSCOPIC TUBAL LIGATION Bilateral 07/20/2020   Procedure: LAPAROSCOPIC TUBAL LIGATION;  Surgeon: Conan Bowens, MD;  Location: [facility];  Service: Gynecology;  Laterality: Bilateral;   TUBAL LIGATION      No family history on file.  Social History Social History   Tobacco Use   Smoking status: Former    Packs/day: .5    Types: Cigarettes    Quit date: 05/22/2015    Years since quitting: 7.8   Smokeless tobacco: Current  Vaping Use   Vaping Use: Every day   Substances: Flavoring  Substance Use Topics   Alcohol use: Yes    Alcohol/week: 0.0 standard drinks of alcohol    Comment: socially   Drug use: No    No Known Allergies  Current Outpatient Medications  Medication Sig Dispense Refill   amoxicillin-clavulanate (AUGMENTIN) 875-125 MG tablet Take 1 tablet by mouth every 12 (twelve) hours. 14 tablet 0   chlorhexidine (PERIDEX) 0.12 % solution 15 mL swish and spit bid (Patient not  taking: Reported on 03/21/2023) 480 mL 0   HYDROcodone-acetaminophen (NORCO/VICODIN) 5-325 MG tablet Take 1-2 tablets by mouth every 6 (six) hours as needed for moderate pain or severe pain. 12 tablet 0   naproxen (NAPROSYN) 500 MG tablet Take 1 tablet (500 mg total) by mouth 2 (two) times daily. 20 tablet 0   No current facility-administered medications for this visit.    Review of Systems Review of Systems Constitutional: positive for fatigue and weight loss Respiratory: negative for cough and wheezing Cardiovascular: negative for chest pain, fatigue and palpitations Gastrointestinal: negative for abdominal pain and change in bowel habits Genitourinary: positive for hot flashes and decreased libido Integument/breast: negative for nipple discharge Musculoskeletal:negative for myalgias Neurological: negative for gait problems and tremors Behavioral/Psych:  positive for depression / anxiety and insomnia.  negative for abusive relationship Endocrine: negative for temperature intolerance      Blood pressure 116/78, pulse 69, height  (1.702 m), weight 108 lb (49 kg).  Physical Exam Physical Exam General:   Alert and no distress  Skin:   no rash or abnormalities  Lungs:   clear to auscultation bilaterally  Heart:   regular rate and rhythm, S1, S2 normal, no murmur, click, rub or gallop  Breasts:   normal without suspicious masses, skin or nipple changes or axillary nodes  Abdomen:  normal findings: no organomegaly, soft, non-tender and no hernia  Pelvis:  External genitalia: normal general appearance Urinary system: urethral meatus  normal and bladder without fullness, nontender Vaginal: normal without tenderness, induration or masses Cervix: normal appearance Adnexa: normal bimanual exam Uterus: anteverted and non-tender, normal size    I have spent a total of 20 minutes of face-to-face time, excluding clinical staff time, reviewing notes and preparing to see patient, ordering  tests and/or medications, and counseling the patient.   Data Reviewed Labs  Assessment     1. Menopausal hot flushes  2. Amenorrhea-hyperprolactinemia syndrome Rx: - FSH/LH - Estradiol - TSH - Prolactin  3. Current mild episode of major depressive disorder, unspecified whether recurrent Rx: - Ambulatory referral to Integrated Behavioral Health  4. Insomnia associated with menopause - decrease caffeine and consider Melatonin / Magnesium at bedtime     Plan   Follow up in 2 weeks  Orders Placed This Encounter  Procedures   FSH/LH   Estradiol   TSH   Prolactin   Ambulatory referral to Integrated Behavioral Health    Referral Priority:   Routine    Referral Type:   Consultation    Referral Reason:   Specialty Services Required    Number of Visits Requested:   1      Brock Bad, MD 03/21/2023 5:52 PM

## 2023-03-22 LAB — TSH: TSH: 1.72 u[IU]/mL (ref 0.450–4.500)

## 2023-03-22 LAB — PROLACTIN: Prolactin: 4.4 ng/mL — ABNORMAL LOW (ref 4.8–33.4)

## 2023-03-22 LAB — ESTRADIOL: Estradiol: <5 pg/mL

## 2023-03-22 LAB — FSH/LH
FSH: 144 m[IU]/mL
LH: 88.8 m[IU]/mL

## 2023-03-23 ENCOUNTER — Other Ambulatory Visit (HOSPITAL_BASED_OUTPATIENT_CLINIC_OR_DEPARTMENT_OTHER): Payer: Self-pay

## 2023-03-23 ENCOUNTER — Inpatient Hospital Stay (HOSPITAL_BASED_OUTPATIENT_CLINIC_OR_DEPARTMENT_OTHER)
Admission: RE | Admit: 2023-03-23 | Discharge: 2023-03-23 | Disposition: A | Discharge status: Home / Self Care | Payer: Medicaid Other | Source: Ambulatory Visit

## 2023-03-23 DIAGNOSIS — Z1231 Encounter for screening mammogram for malignant neoplasm of breast: Secondary | ICD-10-CM

## 2023-03-27 ENCOUNTER — Other Ambulatory Visit: Payer: Self-pay | Admitting: Obstetrics

## 2023-03-27 DIAGNOSIS — N951 Menopausal and female climacteric states: Secondary | ICD-10-CM

## 2023-03-27 MED ORDER — CLIMARA PRO 0.045-0.015 MG/DAY TD PTWK
1.0000 | MEDICATED_PATCH | TRANSDERMAL | 12 refills | Status: DC
Start: 2023-03-27 — End: 2023-03-28

## 2023-03-28 ENCOUNTER — Telehealth (INDEPENDENT_AMBULATORY_CARE_PROVIDER_SITE_OTHER): Payer: Medicaid Other | Admitting: Obstetrics

## 2023-03-28 ENCOUNTER — Encounter: Payer: Self-pay | Admitting: Obstetrics

## 2023-03-28 DIAGNOSIS — N951 Menopausal and female climacteric states: Secondary | ICD-10-CM

## 2023-03-28 DIAGNOSIS — N910 Primary amenorrhea: Secondary | ICD-10-CM

## 2023-03-28 MED ORDER — CLIMARA PRO 0.045-0.015 MG/DAY TD PTWK: 1.0000 | MEDICATED_PATCH | TRANSDERMAL | 12 refills | Status: DC

## 2023-03-28 NOTE — Progress Notes (Signed)
GYNECOLOGY VIRTUAL VISIT ENCOUNTER NOTE  Provider location: [facility] for [facility]   Patient location: Home  I connected with [name] on 03/28/23 at 10:35 AM EDT by MyChart Video Encounter and verified that I am speaking with the correct person using two identifiers.   I discussed the limitations, risks, security and privacy concerns of performing an evaluation and management service virtually and the availability of in person appointments. I also discussed with the patient that there may be a patient responsible charge related to this service. The patient expressed understanding and agreed to proceed.   History:  CECILY LAWHORNE is a 42 y.o. [id number] female being evaluated today for lab results for history of amenorrhea and hot flashes. She denies any abnormal vaginal discharge, bleeding, pelvic pain or other concerns.       Past Medical History:  Diagnosis Date   Hypertension    MDD (major depressive disorder)    Vaginal Pap smear, abnormal    Past Surgical History:  Procedure Laterality Date   IUD REMOVAL N/A 07/20/2020   Procedure: INTRAUTERINE DEVICE (IUD) REMOVAL;  Surgeon: Conan Bowens, MD;  Location: [facility];  Service: Gynecology;  Laterality: N/A;   LAPAROSCOPIC BILATERAL SALPINGECTOMY Bilateral 07/20/2020   Procedure: LAPAROSCOPIC BILATERAL SALPINGECTOMY;  Surgeon: Conan Bowens, MD;  Location: [facility];  Service: Gynecology;  Laterality: Bilateral;   LAPAROSCOPIC TUBAL LIGATION Bilateral 07/20/2020   Procedure: LAPAROSCOPIC TUBAL LIGATION;  Surgeon: Conan Bowens, MD;  Location: [facility];  Service: Gynecology;  Laterality: Bilateral;   TUBAL LIGATION     The following portions of the patient's history were reviewed and updated as appropriate: allergies, current medications, past family history, past medical history, past social history, past surgical history and problem list.   Health  Maintenance:  Normal pap and negative HRHPV on 10-17-2022.  Normal mammogram on 06-08-2021.   Review of Systems:  Pertinent items noted in HPI and remainder of comprehensive ROS otherwise negative.  Physical Exam:   General:  Alert, oriented and cooperative. Patient appears to be in no acute distress.  Mental Status: Normal mood and affect. Normal behavior. Normal judgment and thought content.   Respiratory: Normal respiratory effort, no problems with respiration noted  Rest of physical exam deferred due to type of encounter  Labs and Imaging Results for orders placed or performed in visit on 03/21/23 (from the past 336 hour(s))  FSH/LH   Collection Time: 03/21/23 11:23 AM  Result Value Ref Range   LH 88.8 mIU/mL   FSH 144.0 mIU/mL  Estradiol   Collection Time: 03/21/23 11:23 AM  Result Value Ref Range   Estradiol <5.0 pg/mL  TSH   Collection Time: 03/21/23 11:23 AM  Result Value Ref Range   TSH 1.720 0.450 - 4.500 uIU/mL  Prolactin   Collection Time: 03/21/23 11:23 AM  Result Value Ref Range   Prolactin 4.4 (L) 4.8 - 33.4 ng/mL   No results found.     Assessment and Plan:     1. Menopausal hot flushes Rx: - estradiol-levonorgestrel (CLIMARA PRO) 0.045-0.015 MG/DAY; Place 1 patch onto the skin once a week.  Dispense: 4 patch; Refill: 12  2. Amenorrhea, primary Rx: - US PELVIC COMPLETE WITH TRANSVAGINAL; Future    - estradiol-levonorgestrel (CLIMARA PRO) 0.045-0.015 MG/DAY; Place 1 patch onto the skin once a week.  Dispense: 4 patch; Refill: 12   I discussed the assessment and treatment plan with the patient.  The patient was provided an opportunity to ask questions and all were answered. The patient agreed with the plan and demonstrated an understanding of the instructions.   The patient was advised to call back or seek an in-person evaluation/go to the ED if the symptoms worsen or if the condition fails to improve as anticipated.  I have spent a total of 10 minutes of  non-face-to-face time, excluding clinical staff time, reviewing notes and preparing to see patient, ordering tests and/or medications, and counseling the patient.    Coral Ceo, MD Center for [facility], [facility], [facility] 03/28/23

## 2023-03-29 ENCOUNTER — Ambulatory Visit (INDEPENDENT_AMBULATORY_CARE_PROVIDER_SITE_OTHER): Payer: Self-pay | Admitting: Licensed Clinical Social Worker

## 2023-03-29 DIAGNOSIS — F320 Major depressive disorder, single episode, mild: Secondary | ICD-10-CM

## 2023-04-03 NOTE — BH Specialist Note (Signed)
Integrated Behavioral Health via Telemedicine Visin/a  04/03/2023 Mckenzie [name] [id number]  Number of Integrated Behavioral Health Clinician visits: 1 Session Start time:  4:00pm Session End time: 4:20pm Total time in minutes: 20 mins   Referring Provider: Dr Clearance Coots  Patient/Family location: Home  [facility] Provider location: [facility]  All persons participating in visit: Mckenzie [name] and LCSW A Monie Shere  Types of Service: Video visit and General Behavioral Integrated Care (BHI)  I connected with Mckenzie [name] and/or Mckenzie [name]'s  via  Telephone or Video Enabled Telemedicine Application  (Video is Caregility application) and verified that I am speaking with the correct person using two identifiers. Discussed confidentiality: Yes   I discussed the limitations of telemedicine and the availability of in person appointments.  Discussed there is a possibility of technology failure and discussed alternative modes of communication if that failure occurs.  I discussed that engaging in this telemedicine visit, they consent to the provision of behavioral healthcare and the services will be billed under their insurance.  Patient and/or legal guardian expressed understanding and consented to Telemedicine visit: Yes   Presenting Concerns: Patient and/or family reports the following symptoms/concerns: depression Duration of problem: approx 6 months ; Severity of problem: mild  Patient and/or Family's Strengths/Protective Factors: Concrete supports in place (healthy food, safe environments, etc.)  Goals Addressed: Patient will:  Reduce symptoms of: depression   Increase knowledge and/or ability of: coping skills   Demonstrate ability to: Increase healthy adjustment to current life circumstances  Progress towards Goals: Ongoing  Interventions: Interventions utilized:  Supportive Counseling Standardized Assessments completed: PHQ 9  Patient and/or Family Response: Ms. Mckenzie [name] reports  depression and adjustment disorder.   Assessment: Patient currently experiencing depressive disorderf.   Patient may benefit from mild depressive disorder .  Plan: Follow up with behavioral health clinician on : as needed  Behavioral recommendations: Prioritze self care, journal writing and reframing negative thought patterns  Referral(s): Integrated [employer] (In Clinic)  I discussed the assessment and treatment plan with the patient and/or parent/guardian. They were provided an opportunity to ask questions and all were answered. They agreed with the plan and demonstrated an understanding of the instructions.   They were advised to call back or seek an in-person evaluation if the symptoms worsen or if the condition fails to improve as anticipated.  Gwyndolyn Saxon, LCSW

## 2023-04-11 ENCOUNTER — Ambulatory Visit (HOSPITAL_BASED_OUTPATIENT_CLINIC_OR_DEPARTMENT_OTHER)
Admission: RE | Admit: 2023-04-11 | Discharge: 2023-04-11 | Disposition: A | Discharge status: Home / Self Care | Payer: Medicaid Other | Source: Ambulatory Visit | Attending: Obstetrics | Admitting: Obstetrics

## 2023-04-11 DIAGNOSIS — N910 Primary amenorrhea: Secondary | ICD-10-CM | POA: Insufficient documentation

## 2023-04-12 ENCOUNTER — Ambulatory Visit: Payer: Self-pay | Admitting: Licensed Clinical Social Worker

## 2023-04-12 DIAGNOSIS — Z91199 Patient's noncompliance with other medical treatment and regimen due to unspecified reason: Secondary | ICD-10-CM

## 2023-04-17 NOTE — BH Specialist Note (Signed)
No show

## 2023-04-22 ENCOUNTER — Ambulatory Visit (HOSPITAL_BASED_OUTPATIENT_CLINIC_OR_DEPARTMENT_OTHER): Admission: RE | Admit: 2023-04-22 | Payer: Medicaid Other | Source: Ambulatory Visit | Admitting: Radiology

## 2023-04-27 ENCOUNTER — Ambulatory Visit (HOSPITAL_BASED_OUTPATIENT_CLINIC_OR_DEPARTMENT_OTHER): Payer: Medicaid Other | Admitting: Radiology

## 2023-10-16 ENCOUNTER — Encounter (HOSPITAL_BASED_OUTPATIENT_CLINIC_OR_DEPARTMENT_OTHER): Payer: Self-pay | Admitting: Family Medicine

## 2023-10-16 ENCOUNTER — Ambulatory Visit (INDEPENDENT_AMBULATORY_CARE_PROVIDER_SITE_OTHER): Payer: Medicaid Other | Admitting: Family Medicine

## 2023-10-16 VITALS — BP 115/85 | HR 72 | Ht 67.0 in | Wt 122.5 lb

## 2023-10-16 DIAGNOSIS — Z0000 Encounter for general adult medical examination without abnormal findings: Secondary | ICD-10-CM

## 2023-10-16 DIAGNOSIS — F331 Major depressive disorder, recurrent, moderate: Secondary | ICD-10-CM | POA: Diagnosis not present

## 2023-10-16 DIAGNOSIS — M545 Low back pain, unspecified: Secondary | ICD-10-CM | POA: Insufficient documentation

## 2023-10-16 DIAGNOSIS — G8929 Other chronic pain: Secondary | ICD-10-CM

## 2023-10-16 DIAGNOSIS — Z7689 Persons encountering health services in other specified circumstances: Secondary | ICD-10-CM

## 2023-10-16 DIAGNOSIS — R7989 Other specified abnormal findings of blood chemistry: Secondary | ICD-10-CM | POA: Diagnosis not present

## 2023-10-16 DIAGNOSIS — N951 Menopausal and female climacteric states: Secondary | ICD-10-CM | POA: Diagnosis not present

## 2023-10-16 MED ORDER — VENLAFAXINE HCL ER 37.5 MG PO CP24: 37.5000 mg | ORAL_CAPSULE | Freq: Every day | ORAL | 2 refills | Status: DC

## 2023-10-16 NOTE — Patient Instructions (Signed)

## 2023-10-16 NOTE — Progress Notes (Signed)
New Patient Office Visit  Subjective    Patient ID: Mckenzie [name], female    DOB: 1980/12/05  Age: 42 y.o. MRN: [id number]  CC:  Chief Complaint  Patient presents with   Menopause    No sex drive, always has hot flashes day and night   Extremity Weakness    Legs, hands, ongoing for about 3-4 months, feels like legs give out. Hands as well, cramps   Back Pain    Intermittent pain, comes every few months and last for a month at a time, does have issues holding her bladder    HPI Mckenzie [name] is a 42 year old female who presents to establish with Primary Care & Sports Medicine at [facility].  Former PCP: OBGYN, Dr. Clearance Coots   MENOPAUSE: Brain fog, decreased sex drive, hot flashes  Has not had menstrual cycle for 4 years  Currently takes multivitamin with vitamin B12  Fallopian tubes & ovarian removed- BTL 2021  BACK PAIN: started when she took a trip "a while ago" has been on going for undetermined amount of time  Pain started in her lower back and comes/goes- she reports that it hangs around for a few weeks and then goes away. 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  Sometimes movement does make it worse, sometimes if she sits too long- her legs feel weak and feels like it increases in severity  Reports that she does have urinary urgency, denies bowel or bladder incontinence       10/16/2023    2:21 PM  GAD 7 : Generalized Anxiety Score  Nervous, Anxious, on Edge 3  Control/stop worrying 3  Worry too much - different things 3  Trouble relaxing 3  Restless 2  Easily annoyed or irritable 3  Afraid - awful might happen 3  Total GAD 7 Score 20  Anxiety Difficulty Very difficult       10/16/2023    2:20 PM 03/21/2023   10:46 AM 10/17/2022   10:31 AM  PHQ9 SCORE ONLY  PHQ-9 Total Score 23 16 6     Outpatient Encounter Medications as of 10/16/2023  Medication Sig   BIOTIN PO Take by mouth.   CALCIUM PO Take by mouth.   Multiple Vitamins-Calcium (ONE-A-DAY WOMENS FORMULA PO) Take by mouth.   venlafaxine XR (EFFEXOR XR) 37.5 MG 24 hr capsule Take 1 capsule (37.5 mg total) by mouth daily with breakfast.   [DISCONTINUED] amoxicillin-clavulanate (AUGMENTIN) 875-125 MG tablet Take 1 tablet by mouth every 12 (twelve) hours. (Patient not taking: Reported on 10/16/2023)   [DISCONTINUED] chlorhexidine (PERIDEX) 0.12 % solution 15 mL swish and spit bid (Patient not taking: Reported on 03/21/2023)   [DISCONTINUED] estradiol-levonorgestrel (CLIMARA PRO) 0.045-0.015 MG/DAY Place 1 patch onto the skin once a week. (Patient not taking: Reported on 10/16/2023)   [DISCONTINUED] HYDROcodone-acetaminophen (NORCO/VICODIN) 5-325 MG tablet Take 1-2 tablets by mouth every 6 (six) hours as needed for moderate pain or severe pain. (Patient not taking: Reported on 10/16/2023)   [DISCONTINUED] naproxen (NAPROSYN) 500 MG tablet Take 1 tablet (500 mg total) by mouth 2 (two) times daily. (Patient not taking: Reported on 03/28/2023)   No facility-administered encounter medications on file as of 10/16/2023.    Past Medical History:  Diagnosis Date   Hypertension    MDD (major depressive disorder)  Vaginal Pap smear, abnormal     Past Surgical History:  Procedure Laterality Date   IUD REMOVAL N/A 07/20/2020   Procedure: INTRAUTERINE DEVICE (IUD) REMOVAL;  Surgeon: Conan Bowens, MD;  Location: [facility];  Service: Gynecology;  Laterality: N/A;   LAPAROSCOPIC BILATERAL SALPINGECTOMY Bilateral 07/20/2020   Procedure: LAPAROSCOPIC BILATERAL SALPINGECTOMY;  Surgeon: Conan Bowens, MD;  Location: [facility];  Service: Gynecology;  Laterality: Bilateral;   LAPAROSCOPIC TUBAL LIGATION Bilateral 07/20/2020   Procedure: LAPAROSCOPIC TUBAL LIGATION;  Surgeon: Conan Bowens, MD;   Location: [facility];  Service: Gynecology;  Laterality: Bilateral;   TUBAL LIGATION      Family History  Problem Relation Age of Onset   Fibromyalgia Cousin      Review of Systems  Constitutional:  Negative for malaise/fatigue and weight loss.  Eyes:  Negative for blurred vision and double vision.  Respiratory:  Negative for cough and shortness of breath.   Cardiovascular:  Negative for chest pain, palpitations and leg swelling.  Gastrointestinal:  Negative for abdominal pain, nausea and vomiting.  Genitourinary:  Positive for urgency. Negative for dysuria, flank pain, frequency and hematuria.  Musculoskeletal:  Positive for back pain and myalgias.  Neurological:  Positive for tingling (numbness in upper & lower extremities). Negative for dizziness, weakness and headaches.  Psychiatric/Behavioral:  Negative for depression and suicidal ideas. The patient is not nervous/anxious and does not have insomnia.         Objective    BP 115/85 Comment: Repeat BP  Pulse 72   Ht 5\' 7"  (1.702 m)   Wt 122 lb 8 oz (55.6 kg)   SpO2 100%   BMI 19.19 kg/m   Physical Exam Vitals reviewed.  Constitutional:      Appearance: Normal appearance.  Cardiovascular:     Rate and Rhythm: Normal rate and regular rhythm.     Pulses: Normal pulses.     Heart sounds: Normal heart sounds.  Pulmonary:     Effort: Pulmonary effort is normal.     Breath sounds: Normal breath sounds.  Musculoskeletal:     Cervical back: Normal. Normal range of motion.     Thoracic back: Normal. Normal range of motion.     Lumbar back: Normal. No bony tenderness. Normal range of motion. Negative right straight leg raise test and negative left straight leg raise test.     Right lower leg: No edema.     Left lower leg: No edema.  Neurological:     Mental Status: She is alert.     Cranial Nerves: Cranial nerves 2-12 are intact.     Sensory: Sensation is intact.     Motor: Motor function is intact.      Coordination: Coordination is intact.     Gait: Gait is intact.     Deep Tendon Reflexes: Reflexes are normal and symmetric.  Psychiatric:        Mood and Affect: Mood normal.        Behavior: Behavior normal.      Assessment & Plan:   1. Encounter to establish care Patient is a 42 year old female who presents today to establish care with primary care at [facility]. Reviewed the past medical history, family history, social history, surgical history, medications and allergies today- updates made as indicated. She has concerns about her mood, menopausal symptoms, and back pain.   2. MDD (major depressive disorder), recurrent episode, moderate (HCC) PHQ9 completed with score of 23.  GAD7 completed with score of 20. Patient reports she suffers from anxiety and depression. Denies SI/HI. Discussed cognitive behavioral therapy (CBT) and first-line medications for mood- including SSRIs and SNRIs. Due to vasomotor symptoms associated with menopause, reasonable to trial Effexor. Discussed that it may take 4-6 weeks to notice therapeutic effect on mood. If symptoms not controlled, discussed increasing dose.  - venlafaxine XR (EFFEXOR XR) 37.5 MG 24 hr capsule; Take 1 capsule (37.5 mg total) by mouth daily with breakfast.  Dispense: 30 capsule; Refill: 2  3. Vasomotor symptoms due to menopause See #2  4. Chronic bilateral low back pain without sciatica Patient reports history of low back pain. Physical exam with no tenderness to palpation along spine. Negative SLR. Motor strength 5/5 in upper and lower extremities. Sensation intact. No red flags present during neurological exam. Will obtain basic labs. If within normal range, would be reasonable to refer to neurology for further evaluation, possible EMG.  5. Low serum prolactin Review of previous labs shows decreased prolactin level. Will repeat prolactin today.   - Prolactin  6. Wellness examination Will complete routine labs.   - CBC  with Differential/Platelet - Comprehensive metabolic panel - Hemoglobin A1c - Lipid panel - TSH Rfx on Abnormal to Free T4   Return in about 6 weeks (around 11/27/2023) for Mood f/u.   Alyson Reedy, FNP

## 2023-10-17 LAB — CBC WITH DIFFERENTIAL/PLATELET
Basophils Absolute: 0.1 10*3/uL (ref 0.0–0.2)
Basos: 1 %
EOS (ABSOLUTE): 0.3 10*3/uL (ref 0.0–0.4)
Eos: 5 %
Hematocrit: 38.3 % (ref 34.0–46.6)
Hemoglobin: 13.2 g/dL (ref 11.1–15.9)
Immature Grans (Abs): 0 10*3/uL (ref 0.0–0.1)
Immature Granulocytes: 0 %
Lymphocytes Absolute: 2.1 10*3/uL (ref 0.7–3.1)
Lymphs: 43 %
MCH: 31.5 pg (ref 26.6–33.0)
MCHC: 34.5 g/dL (ref 31.5–35.7)
MCV: 91 fL (ref 79–97)
Monocytes Absolute: 0.3 10*3/uL (ref 0.1–0.9)
Monocytes: 7 %
Neutrophils Absolute: 2.1 10*3/uL (ref 1.4–7.0)
Neutrophils: 44 %
Platelets: 242 10*3/uL (ref 150–450)
RBC: 4.19 x10E6/uL (ref 3.77–5.28)
RDW: 13.7 % (ref 11.7–15.4)
WBC: 4.9 10*3/uL (ref 3.4–10.8)

## 2023-10-17 LAB — LIPID PANEL
Chol/HDL Ratio: 2.9 ratio (ref 0.0–4.4)
Cholesterol, Total: 199 mg/dL (ref 100–199)
HDL: 69 mg/dL (ref >39)
LDL Chol Calc (NIH): 116 mg/dL — ABNORMAL HIGH (ref 0–99)
Triglycerides: 75 mg/dL (ref 0–149)
VLDL Cholesterol Cal: 14 mg/dL (ref 5–40)

## 2023-10-17 LAB — COMPREHENSIVE METABOLIC PANEL
ALT: 8 [IU]/L (ref 0–32)
AST: 19 [IU]/L (ref 0–40)
Albumin: 4.5 g/dL (ref 3.9–4.9)
Alkaline Phosphatase: 83 [IU]/L (ref 44–121)
BUN/Creatinine Ratio: 11 (ref 9–23)
BUN: 9 mg/dL (ref 6–24)
Bilirubin Total: 0.3 mg/dL (ref 0.0–1.2)
CO2: 26 mmol/L (ref 20–29)
Calcium: 9.9 mg/dL (ref 8.7–10.2)
Chloride: 102 mmol/L (ref 96–106)
Creatinine, Ser: 0.8 mg/dL (ref 0.57–1.00)
Globulin, Total: 2.3 g/dL (ref 1.5–4.5)
Glucose: 75 mg/dL (ref 70–99)
Potassium: 3.8 mmol/L (ref 3.5–5.2)
Sodium: 145 mmol/L — ABNORMAL HIGH (ref 134–144)
Total Protein: 6.8 g/dL (ref 6.0–8.5)
eGFR: 94 mL/min/{1.73_m2} (ref >59)

## 2023-10-17 LAB — TSH RFX ON ABNORMAL TO FREE T4: TSH: 1.6 u[IU]/mL (ref 0.450–4.500)

## 2023-10-17 LAB — HEMOGLOBIN A1C
Est. average glucose Bld gHb Est-mCnc: 108 mg/dL
Hgb A1c MFr Bld: 5.4 % (ref 4.8–5.6)

## 2023-10-17 LAB — PROLACTIN: Prolactin: 4 ng/mL — ABNORMAL LOW (ref 4.8–33.4)

## 2023-10-20 ENCOUNTER — Encounter (HOSPITAL_BASED_OUTPATIENT_CLINIC_OR_DEPARTMENT_OTHER): Payer: Self-pay | Admitting: Family Medicine

## 2023-10-25 ENCOUNTER — Encounter (HOSPITAL_BASED_OUTPATIENT_CLINIC_OR_DEPARTMENT_OTHER): Payer: Self-pay | Admitting: Family Medicine

## 2023-10-25 ENCOUNTER — Encounter (HOSPITAL_COMMUNITY): Payer: Self-pay

## 2023-10-25 ENCOUNTER — Ambulatory Visit (HOSPITAL_COMMUNITY)
Admission: EM | Admit: 2023-10-25 | Discharge: 2023-10-25 | Disposition: A | Discharge status: Home / Self Care | Payer: Medicaid Other | Attending: Family Medicine | Admitting: Family Medicine

## 2023-10-25 DIAGNOSIS — M25552 Pain in left hip: Secondary | ICD-10-CM | POA: Diagnosis not present

## 2023-10-25 DIAGNOSIS — M545 Low back pain, unspecified: Secondary | ICD-10-CM

## 2023-10-25 LAB — POCT URINALYSIS DIP (MANUAL ENTRY)
Bilirubin, UA: NEGATIVE
Glucose, UA: NEGATIVE mg/dL
Ketones, POC UA: NEGATIVE mg/dL
Leukocytes, UA: NEGATIVE
Nitrite, UA: NEGATIVE
Protein Ur, POC: NEGATIVE mg/dL
Spec Grav, UA: 1.02 (ref 1.010–1.025)
Urobilinogen, UA: 1 U/dL
pH, UA: 5.5 (ref 5.0–8.0)

## 2023-10-25 NOTE — ED Provider Notes (Signed)
[facility]    CSN: [id number] Arrival date & time: 10/25/23  2725      History   Chief Complaint Chief Complaint  Patient presents with   Back Pain    HPI Mckenzie [name] is a 42 y.o. female.   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  She has not been doing any exercise and normally does stretches with some topical Biofreeze for relief.  She has not had any pain radiating to the groin, fever, chills, unexplained weight loss, dysuria, blood in her urine, falls, numbness or weakness, loss of bowel or bladder.  She does note some difficulty with balance but no dizziness, or lightheadedness.  The history is provided by the patient.  Back Pain Associated symptoms: weakness   Associated symptoms: no dysuria, no fever, no headaches and no pelvic pain     Past Medical History:  Diagnosis Date   Benign essential hypertension with delivery 10/06/2014   Hypertension    Hypertonic, incoordinate, and prolonged uterine contractions 09/11/2014   MDD (major depressive disorder)    NVD (normal vaginal delivery) 09/11/2014   Vaginal Pap smear, abnormal     Patient Active Problem List   Diagnosis Date Noted   MDD (major depressive disorder), recurrent episode, moderate (HCC) 10/16/2023   Vasomotor symptoms due to menopause 10/16/2023   Chronic bilateral low back pain without sciatica 10/16/2023   Low serum prolactin 10/16/2023    Past Surgical History:  Procedure Laterality Date   IUD REMOVAL N/A 07/20/2020   Procedure: INTRAUTERINE DEVICE (IUD) REMOVAL;  Surgeon: Conan Bowens, MD;  Location: [facility];  Service: Gynecology;  Laterality: N/A;   LAPAROSCOPIC BILATERAL SALPINGECTOMY Bilateral 07/20/2020   Procedure: LAPAROSCOPIC BILATERAL SALPINGECTOMY;  Surgeon: Conan Bowens, MD;   Location: [facility];  Service: Gynecology;  Laterality: Bilateral;   LAPAROSCOPIC TUBAL LIGATION Bilateral 07/20/2020   Procedure: LAPAROSCOPIC TUBAL LIGATION;  Surgeon: Conan Bowens, MD;  Location: [facility];  Service: Gynecology;  Laterality: Bilateral;   TUBAL LIGATION      OB History     Gravida  5   Para  5   Term  5   Preterm      AB      Living  5      SAB      IAB      Ectopic      Multiple      Live Births  5            Home Medications    Prior to Admission medications   Medication Sig Start Date End Date Taking? Authorizing Provider  BIOTIN PO Take by mouth.    [provider]  CALCIUM PO Take by mouth.    [provider]  Multiple Vitamins-Calcium (ONE-A-DAY WOMENS FORMULA PO) Take by mouth.    [provider]  venlafaxine XR (EFFEXOR XR) 37.5 MG 24 hr capsule Take 1 capsule (37.5 mg total) by mouth daily with breakfast. 10/16/23   Alyson Reedy, FNP    Family History Family History  Problem Relation Age of Onset   Fibromyalgia Cousin     Social History Social History   Tobacco Use   Smoking status: Former    Current packs/day: 0.00    Types: Cigarettes  Quit date: 05/22/2015    Years since quitting: 8.4   Smokeless tobacco: Current  Vaping Use   Vaping status: Every Day   Substances: Flavoring  Substance Use Topics   Alcohol use: Yes    Alcohol/week: 0.0 standard drinks of alcohol    Comment: socially   Drug use: No     Allergies   Patient has no known allergies.   Review of Systems Review of Systems  Constitutional:  Negative for activity change, chills and fever.  Eyes:  Negative for visual disturbance.  Genitourinary:  Negative for difficulty urinating, dysuria, flank pain, hematuria, menstrual problem and pelvic pain.  Musculoskeletal:  Positive for back pain and gait problem. Negative for joint swelling and myalgias.  Neurological:  Positive for  weakness. Negative for dizziness, tremors, light-headedness and headaches.     Physical Exam Triage Vital Signs ED Triage Vitals [10/25/23 0945]  Encounter Vitals Group     BP 121/83     Systolic BP Percentile      Diastolic BP Percentile      Pulse Rate 69     Resp 16     Temp 98.3 F (36.8 C)     Temp Source Oral     SpO2 98 %     Weight      Height      Head Circumference      Peak Flow      Pain Score      Pain Loc      Pain Education      Exclude from Growth Chart    No data found.  Updated Vital Signs BP 121/83 (BP Location: Left Arm)   Pulse 69   Temp 98.3 F (36.8 C) (Oral)   Resp 16   SpO2 98%   Visual Acuity Right Eye Distance:   Left Eye Distance:   Bilateral Distance:    Right Eye Near:   Left Eye Near:    Bilateral Near:     Physical Exam Vitals reviewed.  Constitutional:      General: She is not in acute distress.    Appearance: Normal appearance. She is normal weight. She is not ill-appearing, toxic-appearing or diaphoretic.  HENT:     Head: Normocephalic and atraumatic.  Abdominal:     General: Abdomen is flat. There is no distension.     Tenderness: There is no abdominal tenderness.  Musculoskeletal:     Comments: Back Exam:  Inspection: Unremarkable, no obvious scoliosis Motion: Flexion 60 deg, Extension 15 deg, Side Bending to 45 deg bilaterally, Rotation to 45 deg bilaterally, flexion elicits mild pain SLR laying: Negative  XSLR laying: Negative  Palpable tenderness: Over the left paraspinal musculature, posterior aspect of the left greater trochanter, and proximal gluteus along the iliac crest FABER: Negative Trendelenburg positive bilaterally Hip strength 5/5 in flexion, extension, but 3/5 in abduction Sensory change: Gross sensation intact to all lumbar and sacral dermatomes.  Reflexes: 2+ at both patellar tendons, 2+ at achilles tendons Strength at foot  Plantar-flexion: 5/5 Dorsi-flexion: 5/5  Quad: 5/5 Hamstring: 5/5 Hip  flexor: 5/5 Hip abductors: 5/5  Gait unremarkable.    Skin:    General: Skin is warm.     Capillary Refill: Capillary refill takes 2 to 3 seconds.     Coloration: Skin is not jaundiced.  Neurological:     General: No focal deficit present.     Mental Status: She is alert and oriented to person, place, and time.  Motor: No weakness.     Coordination: Coordination normal.     Gait: Gait normal.     Deep Tendon Reflexes: Reflexes normal.  Psychiatric:        Mood and Affect: Mood normal.        Behavior: Behavior normal.        Thought Content: Thought content normal.      UC Treatments / Results  Labs (all labs ordered are listed, but only abnormal results are displayed) Labs Reviewed  POCT URINALYSIS DIP (MANUAL ENTRY) - Abnormal; Notable for the following components:      Result Value   Blood, UA moderate (*)    All other components within normal limits    EKG   Radiology No results found.  Procedures Procedures (including critical care time)  Medications Ordered in UC Medications - No data to display  Initial Impression / Assessment and Plan / UC Course  I have reviewed the triage vital signs and the nursing notes.  Pertinent labs & imaging results that were available during my care of the patient were reviewed by me and considered in my medical decision making (see chart for details).     Lumbar spasm -Likely secondary to below - There is no involvement of the spinous processes and she does not have any history of trauma. 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 - We discussed the use of topical lidocaine, heat, and gentle stretching as well as Tylenol for pain although she does not like to take oral medications. - Strengthening mobility exercises given.  She would likely benefit from strengthening as below. - The patient voiced understanding and agreement with the plan  Greater trochanteric pain syndrome. - She has  marked weakness of the gluteus medius and minimus.  We discussed the etiology of her symptoms and need for strengthening.  The pain is not bad enough to warrant injection today. -Hip abductor exercises given for strengthening. - We discussed performing these every day.  She should have some marked relief within 4-6 weeks.  If not she can follow-up with sports medicine clinic.  Final Clinical Impressions(s) / UC Diagnoses   Final diagnoses:  Acute left-sided low back pain without sciatica  Greater trochanteric pain syndrome of left lower extremity     Discharge Instructions      You have some weakness in the muscles that stabilize your hip. We also have a small spasm in the muscles in your left lower back. You can use lidocaine patches for relief as well as heat and gentle stretching Follow-up with the provided strengthening exercises every day for the next 4 weeks.  If there is no improvement in your pain at that point follow-up with the sports medicine clinic for further evaluation     ED Prescriptions   None    PDMP not reviewed this encounter.   Ivor Messier, MD 10/25/23 1053

## 2023-10-25 NOTE — Discharge Instructions (Signed)
You have some weakness in the muscles that stabilize your hip. We also have a small spasm in the muscles in your left lower back. You can use lidocaine patches for relief as well as heat and gentle stretching Follow-up with the provided strengthening exercises every day for the next 4 weeks.  If there is no improvement in your pain at that point follow-up with the sports medicine clinic for further evaluation

## 2023-10-25 NOTE — ED Triage Notes (Signed)
Pt presents with lower back pain for several months. Pt reports  Dysuria.

## 2023-10-27 ENCOUNTER — Encounter (HOSPITAL_COMMUNITY): Payer: Self-pay

## 2023-10-27 ENCOUNTER — Emergency Department (HOSPITAL_COMMUNITY)
Admission: EM | Admit: 2023-10-27 | Discharge: 2023-10-27 | Disposition: A | Discharge status: Home / Self Care | Payer: Medicaid Other | Attending: Emergency Medicine | Admitting: Emergency Medicine

## 2023-10-27 ENCOUNTER — Other Ambulatory Visit: Payer: Self-pay

## 2023-10-27 DIAGNOSIS — Z5321 Procedure and treatment not carried out due to patient leaving prior to being seen by health care provider: Secondary | ICD-10-CM | POA: Insufficient documentation

## 2023-10-27 DIAGNOSIS — R202 Paresthesia of skin: Secondary | ICD-10-CM | POA: Insufficient documentation

## 2023-10-27 DIAGNOSIS — M545 Low back pain, unspecified: Secondary | ICD-10-CM | POA: Diagnosis present

## 2023-10-27 LAB — CBC
HCT: 38.2 % (ref 36.0–46.0)
Hemoglobin: 12.8 g/dL (ref 12.0–15.0)
MCH: 30.1 pg (ref 26.0–34.0)
MCHC: 33.5 g/dL (ref 30.0–36.0)
MCV: 89.9 fL (ref 80.0–100.0)
Platelets: 222 10*3/uL (ref 150–400)
RBC: 4.25 MIL/uL (ref 3.87–5.11)
RDW: 13.1 % (ref 11.5–15.5)
WBC: 7.9 10*3/uL (ref 4.0–10.5)
nRBC: 0 % (ref 0.0–0.2)

## 2023-10-27 LAB — URINALYSIS, ROUTINE W REFLEX MICROSCOPIC
Bilirubin Urine: NEGATIVE
Glucose, UA: NEGATIVE mg/dL
Ketones, ur: NEGATIVE mg/dL
Leukocytes,Ua: NEGATIVE
Nitrite: NEGATIVE
Protein, ur: NEGATIVE mg/dL
Specific Gravity, Urine: 1.006 (ref 1.005–1.030)
pH: 6 (ref 5.0–8.0)

## 2023-10-27 LAB — BASIC METABOLIC PANEL
Anion gap: 8 (ref 5–15)
BUN: 9 mg/dL (ref 6–20)
CO2: 28 mmol/L (ref 22–32)
Calcium: 9.4 mg/dL (ref 8.9–10.3)
Chloride: 102 mmol/L (ref 98–111)
Creatinine, Ser: 0.88 mg/dL (ref 0.44–1.00)
GFR, Estimated: >60 mL/min (ref >60)
Glucose, Bld: 105 mg/dL — ABNORMAL HIGH (ref 70–99)
Potassium: 3.6 mmol/L (ref 3.5–5.1)
Sodium: 138 mmol/L (ref 135–145)

## 2023-10-27 LAB — PREGNANCY, URINE: Preg Test, Ur: NEGATIVE

## 2023-10-27 MED ORDER — ONDANSETRON HCL 4 MG/2ML IJ SOLN: 4.0000 mg | Freq: Once | INTRAMUSCULAR | Status: DC

## 2023-10-27 MED ORDER — HYDROMORPHONE HCL 1 MG/ML IJ SOLN: 0.5000 mg | Freq: Once | INTRAMUSCULAR | Status: DC | PRN

## 2023-10-27 MED ORDER — DIAZEPAM 2 MG PO TABS: 2.0000 mg | ORAL_TABLET | Freq: Once | ORAL | Status: DC | PRN

## 2023-10-27 MED ORDER — CYCLOBENZAPRINE HCL 10 MG PO TABS: 5.0000 mg | ORAL_TABLET | Freq: Two times a day (BID) | ORAL | 0 refills | Status: DC | PRN

## 2023-10-27 MED ORDER — CELECOXIB 200 MG PO CAPS: 200.0000 mg | ORAL_CAPSULE | Freq: Two times a day (BID) | ORAL | 0 refills | Status: DC

## 2023-10-27 MED ORDER — METHYLPREDNISOLONE 4 MG PO TBPK
ORAL_TABLET | ORAL | 0 refills | Status: DC
Start: 2023-10-27 — End: 2023-12-03

## 2023-10-27 NOTE — ED Provider Notes (Signed)
[place] EMERGENCY DEPARTMENT AT [facility] Provider Note   CSN: [id number] Arrival date & time: 10/27/23  1255     History  Chief Complaint  Patient presents with   Back Pain    Mckenzie [name] is a 42 y.o. female who comes in the emergency department chief complaint of back pain.  Patient was seen for back pain 2 days ago.  At that time she was denying urinary incontinence or numbness however today states that this was incorrect.  She is complaining of recurrent left lower back pain that is worse with movement and standing it radiates into her left buttock and hip.  The pain does not progress past the buttock or hip.  She also has associated bilateral lower extremity numbness rating down the back of both legs.  She complains of paresthesia in the feet.  She states that occasionally her legs buckle.  She does not like taking medicine but has taken multiple anti-inflammatories, tramadol and pain pill from her mother without much relief.  Patient also had an episode of urinary incontinence 2 days ago.  She denies any upper extremity involvement.  Patient also declines pain medications offered at HPI taking.   Back Pain      Home Medications Prior to Admission medications   Medication Sig Start Date End Date Taking? Authorizing Provider  BIOTIN PO Take by mouth.    [provider]  CALCIUM PO Take by mouth.    [provider]  Multiple Vitamins-Calcium (ONE-A-DAY WOMENS FORMULA PO) Take by mouth.    [provider]  venlafaxine XR (EFFEXOR XR) 37.5 MG 24 hr capsule Take 1 capsule (37.5 mg total) by mouth daily with breakfast. 10/16/23   Alyson Reedy, FNP      Allergies    Patient has no known allergies.    Review of Systems   Review of Systems  Musculoskeletal:  Positive for back pain.    Physical Exam Updated Vital Signs BP 112/83 (BP Location: Right Arm)   Pulse 78   Temp 98.3 F (36.8 C) (Oral)   Resp 14   Ht 5\' 7"  (1.702  m)   Wt 55.8 kg   SpO2 100%   BMI 19.26 kg/m  Physical Exam Vitals and nursing note reviewed.  Constitutional:      General: She is not in acute distress.    Appearance: She is well-developed. She is not diaphoretic.  HENT:     Head: Normocephalic and atraumatic.     Right Ear: External ear normal.     Left Ear: External ear normal.     Nose: Nose normal.     Mouth/Throat:     Mouth: Mucous membranes are moist.  Eyes:     General: No scleral icterus.    Conjunctiva/sclera: Conjunctivae normal.  Cardiovascular:     Rate and Rhythm: Normal rate and regular rhythm.     Heart sounds: Normal heart sounds. No murmur heard.    No friction rub. No gallop.  Pulmonary:     Effort: Pulmonary effort is normal. No respiratory distress.     Breath sounds: Normal breath sounds.  Abdominal:     General: Bowel sounds are normal. There is no distension.     Palpations: Abdomen is soft. There is no mass.     Tenderness: There is no abdominal tenderness. There is no guarding.  Musculoskeletal:     Cervical back: Normal range of motion.     Comments: Midline spinal tenderness at  about L3.  Left lumbar tenderness in the QL region and lumbar paraspinal region.  Patellar reflex 1+ on the right and 2+ on the left Ambulatory.  Skin:    General: Skin is warm and dry.  Neurological:     Mental Status: She is alert and oriented to person, place, and time.  Psychiatric:        Behavior: Behavior normal.     ED Results / Procedures / Treatments   Labs (all labs ordered are listed, but only abnormal results are displayed) Labs Reviewed  URINALYSIS, ROUTINE W REFLEX MICROSCOPIC - Abnormal; Notable for the following components:      Result Value   Hgb urine dipstick LARGE (*)    Bacteria, UA RARE (*)    All other components within normal limits    EKG None  Radiology No results found.  Procedures Procedures    Medications Ordered in ED Medications - No data to display  ED Course/  Medical Decision Making/ A&P Clinical Course as of 10/27/23 1721  Sat Oct 27, 2023  1538 Case discussed with Dr. Derry Lory who recommends MRI imaging with and without of the entire brain and spinal cord to rule out demyelinating issue. [AH]    Clinical Course User Index [AH] Arthor Captain, PA-C                                 Medical Decision Making Amount and/or Complexity of Data Reviewed Labs: ordered.  Risk Prescription drug management.   42 year old female with back pain.  The emergent differential diagnosis for back pain includes but is not limited to fracture, muscle strain, cauda equina, spinal stenosis. DDD, ankylosing spondylitis, acute ligamentous injury, disk herniation, spondylolisthesis, Epidural compression syndrome, metastatic cancer, transverse myelitis, vertebral osteomyelitis, diskitis, kidney stone, pyelonephritis, AAA, Perforated ulcer, Retrocecal appendicitis, pancreatitis, bowel obstruction, retroperitoneal hemorrhage or mass, meningitis. The patient Case discussed as per ED course with Dr. Derry Lory who recommended MRI imaging of the entire neurologic axis however patient became frustrated with the wait time.  She is no longer willing to stay.  She is ambulatory.  She has been here for several hours without any episode of fecal or urine incontinence. \I have ordered medication for treatment of her discomfort.  She will follow-up with her PCP in the outpatient setting.   Patient is leaving AGAINST MEDICAL ADVICE. Date: 10/27/2023 Patient: Mckenzie [name] Admitted: 10/27/2023 12:58 PM Attending Provider: Kristine Royal, MD  [name] or her authorized caregiver has made the decision for the patient to leave the emergency department against the advice of Toshie Demelo,PA-C .  She or her authorized caregiver has been informed and understands the inherent risks, including death.  She or her authorized caregiver has decided to accept the responsibility for  this decision. [name] and all necessary parties have been advised that she may return for further evaluation or treatment. Her condition at time of discharge was Stable.  [name] had current vital signs as follows:  Blood pressure 118/87, pulse 74, temperature 98.3 F (36.8 C), temperature source Oral, resp. rate 17, height 5\' 7"  (1.702 m), weight 55.8 kg, SpO2 98%.     Sabela Pflieger 10/27/2023           Final Clinical Impression(s) / ED Diagnoses Final diagnoses:  None    Rx / DC Orders ED Discharge Orders     None  Tequia, [place], PA-C 10/27/23 2322    Wynetta Fines, MD 10/27/23 [phone]

## 2023-10-27 NOTE — ED Triage Notes (Signed)
Pt states she has ongoing back pain and recently it has came back and worsened. Pt c/o lower back pain radiating to left hip. Pt c/o numbness and tingling in both legs, has had urinary incontinence which occurred 2 days ago. Pt unsure of injury.

## 2023-10-27 NOTE — Discharge Instructions (Signed)
You are leaving AGAINST MEDICAL ADVICE.  Please understand that we are not able to fully rule out emergency issues however you are invited to return at any time.  You may otherwise follow-up with your primary care physician to have an outpatient workup.  I have ordered some medications to the [facility] pharmacy on [address] for your back pain that may help reduce the symptoms you are having.. When you leave AGAINST MEDICAL ADVICE you could have worsening in your condition including permanent disability and/or death. SEEK IMMEDIATE MEDICAL ATTENTION IF: New numbness, tingling, weakness, or problem with the use of your arms or legs.  Severe back pain not relieved with medications.  Change in bowel or bladder control.  Increasing pain in any areas of the body (such as chest or abdominal pain).  Shortness of breath, dizziness or fainting.  Nausea (feeling sick to your stomach), vomiting, fever, or sweats.

## 2023-10-30 ENCOUNTER — Inpatient Hospital Stay (HOSPITAL_BASED_OUTPATIENT_CLINIC_OR_DEPARTMENT_OTHER): Payer: Medicaid Other | Admitting: Family Medicine

## 2023-10-30 ENCOUNTER — Other Ambulatory Visit (HOSPITAL_BASED_OUTPATIENT_CLINIC_OR_DEPARTMENT_OTHER): Payer: Self-pay

## 2023-10-30 DIAGNOSIS — R29898 Other symptoms and signs involving the musculoskeletal system: Secondary | ICD-10-CM

## 2023-10-30 DIAGNOSIS — M545 Low back pain, unspecified: Secondary | ICD-10-CM

## 2023-10-30 NOTE — Addendum Note (Signed)
Addended by: Katharine Look on: 10/30/2023 09:38 AM   Modules accepted: Orders

## 2023-11-08 ENCOUNTER — Other Ambulatory Visit (HOSPITAL_BASED_OUTPATIENT_CLINIC_OR_DEPARTMENT_OTHER): Payer: Self-pay | Admitting: Family Medicine

## 2023-11-08 DIAGNOSIS — F331 Major depressive disorder, recurrent, moderate: Secondary | ICD-10-CM

## 2023-11-19 ENCOUNTER — Ambulatory Visit
Admission: RE | Admit: 2023-11-19 | Discharge: 2023-11-19 | Disposition: A | Discharge status: Home / Self Care | Payer: Medicaid Other | Source: Ambulatory Visit | Attending: Family Medicine | Admitting: Family Medicine

## 2023-11-19 MED ORDER — GADOPICLENOL 0.5 MMOL/ML IV SOLN
6.0000 mL | Freq: Once | INTRAVENOUS | Status: AC | PRN
  Administered 2023-11-19: 6 mL via INTRAVENOUS

## 2023-11-29 ENCOUNTER — Encounter (HOSPITAL_BASED_OUTPATIENT_CLINIC_OR_DEPARTMENT_OTHER): Payer: Self-pay

## 2023-11-29 ENCOUNTER — Ambulatory Visit (HOSPITAL_BASED_OUTPATIENT_CLINIC_OR_DEPARTMENT_OTHER): Payer: Medicaid Other | Admitting: Family Medicine

## 2023-12-03 ENCOUNTER — Encounter (HOSPITAL_BASED_OUTPATIENT_CLINIC_OR_DEPARTMENT_OTHER): Payer: Self-pay | Admitting: Family Medicine

## 2023-12-03 ENCOUNTER — Ambulatory Visit (INDEPENDENT_AMBULATORY_CARE_PROVIDER_SITE_OTHER): Payer: Medicaid Other | Admitting: Family Medicine

## 2023-12-03 VITALS — BP 103/75 | HR 66 | Ht 67.0 in | Wt 120.0 lb

## 2023-12-03 DIAGNOSIS — F331 Major depressive disorder, recurrent, moderate: Secondary | ICD-10-CM | POA: Diagnosis not present

## 2023-12-03 DIAGNOSIS — G8929 Other chronic pain: Secondary | ICD-10-CM

## 2023-12-03 DIAGNOSIS — M545 Low back pain, unspecified: Secondary | ICD-10-CM | POA: Diagnosis not present

## 2023-12-03 MED ORDER — VENLAFAXINE HCL ER 37.5 MG PO CP24: 37.5000 mg | ORAL_CAPSULE | Freq: Every day | ORAL | 3 refills | Status: AC

## 2023-12-03 NOTE — Progress Notes (Signed)
Established Patient Office Visit  Subjective  Patient ID: Mckenzie [name], female    DOB: [birth year]  Age: 42 y.o. MRN: [id number]  Chief Complaint  Patient presents with   Depression   Mckenzie [name] is a 42 year old female patient who presents today for mood follow-up. She reports Effexor is not helping with her mood or hot flashes. She reports she took it for the first two weeks and stopped taking it since it was not helping improve her symptoms. Only menopausal symptom is sweatiness- does not last long. Probably 3-4 times per day for about 3-4 minutes.   Works as CMA- heavy lifting  Taking ibuprofen      12/03/2023   10:47 AM 10/16/2023    2:21 PM  GAD 7 : Generalized Anxiety Score  Nervous, Anxious, on Edge 1 3  Control/stop worrying 3 3  Worry too much - different things 3 3  Trouble relaxing 2 3  Restless 1 2  Easily annoyed or irritable 0 3  Afraid - awful might happen 0 3  Total GAD 7 Score 10 20  Anxiety Difficulty Somewhat difficult Very difficult       12/03/2023   10:47 AM 10/16/2023    2:20 PM 03/21/2023   10:46 AM  PHQ9 SCORE ONLY  PHQ-9 Total Score 15 23 16      ROS: See HPI    Objective:     BP 103/75   Pulse 66   Ht 5\' 7"  (1.702 m)   Wt 120 lb (54.4 kg)   SpO2 100%   BMI 18.79 kg/m  BP Readings from Last 3 Encounters:  12/03/23 103/75  10/27/23 118/87  10/25/23 121/83    Physical Exam Vitals reviewed.  Constitutional:      Appearance: Normal appearance.  Cardiovascular:     Rate and Rhythm: Normal rate and regular rhythm.     Pulses: Normal pulses.     Heart sounds: Normal heart sounds.  Pulmonary:     Effort: Pulmonary effort is normal.     Breath sounds: Normal breath sounds.  Neurological:     Mental Status: She is alert.  Psychiatric:        Mood and Affect: Mood normal.        Behavior: Behavior normal.       Assessment & Plan:   1. MDD (major depressive disorder), recurrent episode, moderate (HCC)  (Primary) Patient presents today for mood follow-up. GAD7 completed with score of 10, and PHQ9 with score of 15. Denies SI/HI. Counseled patient about taking medication daily as prescribed. She would like to restart this medication and give it adequate time to see if her symptoms improve. Advised her to reach out sooner than scheduled follow-up if she has any new symptoms or concerns.  - venlafaxine XR (EFFEXOR-XR) 37.5 MG 24 hr capsule; Take 1 capsule (37.5 mg total) by mouth daily.  Dispense: 30 capsule; Refill: 3  2. Chronic bilateral low back pain without sciatica Discussed recent findings on MRI. Patient continues to take ibuprofen 800mg  PRN. Denies taking celecoxib. Discussed plan of care is initial conservative management- including OTC NSAIDs and physical therapy. Plan to follow-up in 6-12 weeks after patient completes PT. If no improvement after PT, would be reasonable to refer to ortho.  - Ambulatory referral to Physical Therapy  Return in about 3 months (around 03/02/2024) for Mood f/u. And back pain.  Spent 30 minutes on this patient encounter, including preparation, chart review, face-to-face counseling with patient and coordination of care,  and documentation of encounter.    Alyson Reedy, FNP

## 2023-12-13 ENCOUNTER — Ambulatory Visit: Payer: Medicaid Other | Attending: Family Medicine

## 2023-12-13 ENCOUNTER — Other Ambulatory Visit: Payer: Self-pay

## 2023-12-13 DIAGNOSIS — M545 Low back pain, unspecified: Secondary | ICD-10-CM | POA: Insufficient documentation

## 2023-12-13 DIAGNOSIS — M6281 Muscle weakness (generalized): Secondary | ICD-10-CM | POA: Diagnosis present

## 2023-12-13 DIAGNOSIS — G8929 Other chronic pain: Secondary | ICD-10-CM | POA: Diagnosis present

## 2023-12-13 DIAGNOSIS — R252 Cramp and spasm: Secondary | ICD-10-CM | POA: Diagnosis present

## 2023-12-13 NOTE — Therapy (Addendum)
 OUTPATIENT PHYSICAL THERAPY THORACOLUMBAR EVALUATION/ DISCHARGE NOTE   Patient Name: Mckenzie [name] MRN: [id number] DOB:March 10, 1981, 43 y.o., female Today's Date: 12/13/2023  END OF SESSION:  PT End of Session - 12/13/23 1016     Visit Number 1    Date for PT Re-Evaluation 02/07/24    Authorization Type [employer] Medicaid-27 visits approved    Authorization - Visit Number 0    Authorization - Number of Visits 27    PT Start Time [phone]    PT Stop Time 1015    PT Time Calculation (min) 27 min    Activity Tolerance Patient tolerated treatment well    Behavior During Therapy [facility] for tasks assessed/performed             Past Medical History:  Diagnosis Date   Benign essential hypertension with delivery 10/06/2014   Hypertension    Hypertonic, incoordinate, and prolonged uterine contractions 09/11/2014   MDD (major depressive disorder)    NVD (normal vaginal delivery) 09/11/2014   Vaginal Pap smear, abnormal    Past Surgical History:  Procedure Laterality Date   IUD REMOVAL N/A 07/20/2020   Procedure: INTRAUTERINE DEVICE (IUD) REMOVAL;  Surgeon: Nicholaus Burnard HERO, MD;  Location: [facility];  Service: Gynecology;  Laterality: N/A;   LAPAROSCOPIC BILATERAL SALPINGECTOMY Bilateral 07/20/2020   Procedure: LAPAROSCOPIC BILATERAL SALPINGECTOMY;  Surgeon: Nicholaus Burnard HERO, MD;  Location: [facility];  Service: Gynecology;  Laterality: Bilateral;   LAPAROSCOPIC TUBAL LIGATION Bilateral 07/20/2020   Procedure: LAPAROSCOPIC TUBAL LIGATION;  Surgeon: Nicholaus Burnard HERO, MD;  Location: [facility];  Service: Gynecology;  Laterality: Bilateral;   TUBAL LIGATION     Patient Active Problem List   Diagnosis Date Noted   MDD (major depressive disorder), recurrent episode, moderate (HCC) 10/16/2023   Vasomotor symptoms due to menopause 10/16/2023   Chronic bilateral low back pain without sciatica 10/16/2023   Low serum prolactin 10/16/2023    REFERRING  PROVIDER: Towana Small, FNP  REFERRING DIAG: M54.50,G89.29 (ICD-10-CM) - Chronic bilateral low back pain without sciatica   Rationale for Evaluation and Treatment: Rehabilitation  THERAPY DIAG:  Chronic bilateral low back pain without sciatica - Plan: PT plan of care cert/re-cert  Cramp and spasm - Plan: PT plan of care cert/re-cert  Muscle weakness (generalized) - Plan: PT plan of care cert/re-cert  ONSET DATE: 2 year history with flare-up 3 months ago  SUBJECTIVE:  SUBJECTIVE STATEMENT: Pt was late for appt due to going to the wrong clinic initially. Pt presents to PT with chronic LBP that has been chronic and intermittent.  Most recent flare-up was October 2024.  Pt went to ED for pain.  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  See below for MRI result.  From ED note on 10/27/23:  AMRI LIEN is a 43 y.o. female who comes in the emergency department chief complaint of back pain.  Patient was seen for back pain 2 days ago.  She is complaining of recurrent left lower back pain that is worse with movement and standing it radiates into her left buttock and hip.  The pain does not progress past the buttock or hip.  She also has associated bilateral lower extremity numbness rating down the back of both legs.  She complains of paresthesia in the feet.  She states that occasionally her legs buckle.  She does not like taking medicine but has taken multiple anti-inflammatories, tramadol  and pain pill from her mother without much relief.  Patient also had an episode of urinary incontinence 2 days ago.  She denies any upper extremity involvement.    PERTINENT HISTORY:  HTN, depression  PAIN:  12/13/23:  Are you having pain?  Yes: NPRS scale: 0-8/10 Pain location: Lt>Rt low back to gluteals  Pain description: aching, sometimes numbness or sharp pain into legs  Aggravating factors: no pattern, more with sitting  Relieving factors: ibuprofen   PRECAUTIONS: None  RED FLAGS: None   WEIGHT BEARING RESTRICTIONS: No  FALLS:  Has patient fallen in last 6 months? No  LIVING ENVIRONMENT: Lives with: lives with their family   OCCUPATION: CNA- hasn't worked since 10/07/23  PLOF: Independent, Vocation/Vocational requirements: CNA-hasn't worked since 10/07/23, and Leisure: shopping  PATIENT GOALS: sit without pain, return to work, reduce pain  NEXT MD VISIT: none  OBJECTIVE:  Note: Objective measures were completed at Evaluation unless otherwise noted.  DIAGNOSTIC FINDINGS:  MRI: 11/19/23  Broad-based left foraminal disc protrusion at L4-5 with moderate left foraminal stenosis and mass effect upon the exiting left L4 nerve root. 2. Mild left foraminal stenosis at L5-S1. 3. No canal stenosis at any level.  PATIENT SURVEYS:  Modified Oswestry 18/50, 36% disability   COGNITION: Overall cognitive status: Within functional limits for tasks assessed     SENSATION: WFL  MUSCLE LENGTH: WFLs without pain  POSTURE: rounded shoulders and forward head  PALPATION: Tension in Lt quadratus, paraspinals and gluteals with trigger points in Lt gluteals.  Reduced PA mobility in lumbar spine  LUMBAR ROM:  Full A/ROM is full with Lt lumbar pain with Lt side bending.   LOWER EXTREMITY ROM:    WFLs  LOWER EXTREMITY MMT:    Bil hips 4/5, core 4/5, knees and ankles 5/5  LUMBAR SPECIAL TESTS:  Slump test: Negative  GAIT: WNLs  TREATMENT DATE:  12/13/23:  HEP established-see below If treatment provided at initial evaluation, no treatment charged due to lack of authorization.  PATIENT  EDUCATION:  Education details: Access Code: [email or web address] Person educated: Patient Education method: Explanation, Demonstration, and Handouts Education comprehension: verbalized understanding and returned demonstration  HOME EXERCISE PROGRAM: Access Code: [email or web address] URL: [email or web address] Date: 12/13/2023 Prepared by: Burnard  Exercises - Supine Lower Trunk Rotation  - 3 x daily - 7 x weekly - 1 sets - 3 reps - 20 hold - Hooklying Single Knee to Chest  - 3 x daily - 7 x weekly - 1 sets - 3 reps - 20 hold - Seated Hamstring Stretch  - 3 x daily - 7 x weekly - 1 sets - 3 reps - 20 hold - Seated Piriformis Stretch with Trunk Bend  - 3 x daily - 7 x weekly - 1 sets - 3 reps - 20 hold  ASSESSMENT:  CLINICAL IMPRESSION: Patient is a 43 y.o. female who was seen today for physical therapy evaluation and treatment for LBP. Pt reports she has had pain for years with most recent flare-up in November.  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  Pt with Lt sided lumbar and gluteal pain and rates the pain as 0-8/10 and is most painful with sitting.  Pt with normal ROM with Lt sided pain, limited lumbar mobility and tension in Lt lumbar musculature.  Patient will benefit from skilled PT to address the below impairments and improve overall function.   OBJECTIVE IMPAIRMENTS: decreased activity tolerance, decreased endurance, difficulty walking, decreased ROM, decreased strength, increased muscle spasms, impaired flexibility, postural dysfunction, and pain.   ACTIVITY LIMITATIONS: sitting and sleeping  PARTICIPATION LIMITATIONS: meal prep, cleaning, driving, shopping, and community activity  PERSONAL FACTORS: Time since onset of injury/illness/exacerbation and 1 comorbidity: lumbar stenosis   are also affecting patient's functional outcome.   REHAB POTENTIAL: Good  CLINICAL DECISION MAKING: Stable/uncomplicated  EVALUATION COMPLEXITY: Low   GOALS: Goals reviewed with patient?  Yes  SHORT TERM GOALS: Target date: 01/10/2024    Be independent with initial HEP Baseline: Goal status: INITIAL  2.  Report > or = to 25% reduction in LBP with daily tasks  Baseline:  Goal status: INITIAL  3.  Improve ODI to < or = to 20% disability (10/50) Baseline: 36%  Goal status: INITIAL  4.  Demonstrate body mechanics modifications for daily and work tasks for lumbar protection Baseline:  Goal status: INITIAL   LONG TERM GOALS: Target date: 02/07/2024    Be independent advanced HEP Baseline:  Goal status: INITIAL  2.   Report > or = to 60% reduction in LBP with daily tasks  Baseline:  Goal status: INITIAL  3.  Improve ODI to < or = to 14% disability (10/50) Baseline: 36% Goal status: INITIAL  4.  Sit without limitation due to LBP Baseline: 1-2 hours  Goal status: INITIAL  5.  Report consistency with a gym routine for strength progression for return to work as CNA Baseline:  Goal status: INITIAL   PLAN:  PT FREQUENCY: 1-2x/week  PT DURATION: 8 weeks  PLANNED INTERVENTIONS: 97110-Therapeutic exercises, 97530- Therapeutic activity, [id number]- Neuromuscular re-education, 97535- Self Care, [id number]- Manual therapy, [id number]- Canalith repositioning, [id number]- Aquatic Therapy, [id number]- Electrical stimulation (unattended), [id number]- Electrical stimulation (manual), Cryotherapy, and Moist heat.  PLAN FOR NEXT SESSION: begin core and hip strength, body mechanics for lifting, review HEP, DN to low back and gluteals (need to issue handout if we do this)  PHYSICAL THERAPY DISCHARGE SUMMARY  Visits from Start of Care: 1  Current functional level related to goals /  functional outcomes: Patient requested to be discharged since she is feeling better.   Patient agrees to discharge. Patient goals were not met. Patient is being discharged due to being pleased with the current functional level.   Kristeen Sar, PT 01/15/24 10:25 AM [facility] [address] [place], [place] [address] Phone # [phone] Fax [phone]

## 2023-12-25 ENCOUNTER — Ambulatory Visit: Payer: Medicaid Other | Admitting: Physical Therapy

## 2023-12-25 ENCOUNTER — Telehealth: Payer: Self-pay | Admitting: Physical Therapy

## 2023-12-25 NOTE — Telephone Encounter (Signed)
Spoke with patient about missed appointment today. Patient got appointment times mixed up. She confirmed her next appointment Thurs 1/23 at 4:15pm. Patient will have to change her appointment times because she has started a new job.   Claude Manges, PT 12/25/23 3:57 PM

## 2023-12-26 NOTE — Therapy (Incomplete)
OUTPATIENT PHYSICAL THERAPY THORACOLUMBAR TREATMENT   Patient Name: Mckenzie [name] MRN: [id number] DOB:01/16/1981, 43 y.o., female Today's Date: 12/26/2023  END OF SESSION:    Past Medical History:  Diagnosis Date   Benign essential hypertension with delivery 10/06/2014   Hypertension    Hypertonic, incoordinate, and prolonged uterine contractions 09/11/2014   MDD (major depressive disorder)    NVD (normal vaginal delivery) 09/11/2014   Vaginal Pap smear, abnormal    Past Surgical History:  Procedure Laterality Date   IUD REMOVAL N/A 07/20/2020   Procedure: INTRAUTERINE DEVICE (IUD) REMOVAL;  Surgeon: Conan Bowens, MD;  Location: [facility];  Service: Gynecology;  Laterality: N/A;   LAPAROSCOPIC BILATERAL SALPINGECTOMY Bilateral 07/20/2020   Procedure: LAPAROSCOPIC BILATERAL SALPINGECTOMY;  Surgeon: Conan Bowens, MD;  Location[facility];  Service: Gynecology;  Laterality: Bilateral;   LAPAROSCOPIC TUBAL LIGATION Bilateral 07/20/2020   Procedure: LAPAROSCOPIC TUBAL LIGATION;  Surgeon: Conan Bowens, MD;  Location: [facility];  Service: Gynecology;  Laterality: Bilateral;   TUBAL LIGATION     Patient Active Problem List   Diagnosis Date Noted   MDD (major depressive disorder), recurrent episode, moderate (HCC) 10/16/2023   Vasomotor symptoms due to menopause 10/16/2023   Chronic bilateral low back pain without sciatica 10/16/2023   Low serum prolactin 10/16/2023    REFERRING PROVIDER: Alyson Reedy, FNP  REFERRING DIAG: M54.50,G89.29 (ICD-10-CM) - Chronic bilateral low back pain without sciatica   Rationale for Evaluation and Treatment: Rehabilitation  THERAPY DIAG:  No diagnosis found.  ONSET DATE: 2 year history with flare-up 3 months ago  SUBJECTIVE:                                                                                                                                                                                            SUBJECTIVE STATEMENT: ***  From ED note on 10/27/23:  Mckenzie [name] is a 43 y.o. female who comes in the emergency department chief complaint of back pain.  Patient was seen for back pain 2 days ago.  She is complaining of recurrent left lower back pain that is worse with movement and standing it radiates into her left buttock and hip.  The pain does not progress past the buttock or hip.  She also has associated bilateral lower extremity numbness rating down the back of both legs.  She complains of paresthesia in the feet.  She states that occasionally her legs buckle.  She does not like taking medicine but has taken multiple anti-inflammatories, tramadol and pain pill from her mother without much relief.  Patient also had an  episode of urinary incontinence 2 days ago.  She denies any upper extremity involvement.    PERTINENT HISTORY:  HTN, depression  PAIN:  12/13/23:  Are you having pain? Yes: NPRS scale: 0-8/10 Pain location: Lt>Rt low back to gluteals  Pain description: aching, sometimes numbness or sharp pain into legs  Aggravating factors: no pattern, more with sitting  Relieving factors: ibuprofen  PRECAUTIONS: None  RED FLAGS: None   WEIGHT BEARING RESTRICTIONS: No  FALLS:  Has patient fallen in last 6 months? No  LIVING ENVIRONMENT: Lives with: lives with their family   OCCUPATION: CNA- hasn't worked since 10/07/23  PLOF: Independent, Vocation/Vocational requirements: CNA-hasn't worked since 10/07/23, and Leisure: shopping  PATIENT GOALS: sit without pain, return to work, reduce pain  NEXT MD VISIT: none  OBJECTIVE:  Note: Objective measures were completed at Evaluation unless otherwise noted.  DIAGNOSTIC FINDINGS:  MRI: 11/19/23  Broad-based left foraminal disc protrusion at L4-5 with moderate left foraminal stenosis and mass effect upon the exiting left L4 nerve root. 2. Mild left foraminal stenosis at L5-S1. 3. No canal stenosis at  any level.  PATIENT SURVEYS:  Modified Oswestry 18/50, 36% disability   COGNITION: Overall cognitive status: Within functional limits for tasks assessed     SENSATION: WFL  MUSCLE LENGTH: WFLs without pain  POSTURE: rounded shoulders and forward head  PALPATION: Tension in Lt quadratus, paraspinals and gluteals with trigger points in Lt gluteals.  Reduced PA mobility in lumbar spine  LUMBAR ROM:  Full A/ROM is full with Lt lumbar pain with Lt side bending.   LOWER EXTREMITY ROM:    WFLs  LOWER EXTREMITY MMT:    Bil hips 4/5, core 4/5, knees and ankles 5/5  LUMBAR SPECIAL TESTS:  Slump test: Negative  GAIT: WNLs  TREATMENT DATE:  12/27/23  LTR SKTC Seated HS Seated fig 4 begin core and hip strength, body mechanics for lifting   DN? L gluteal/lumbar  12/13/23:  HEP established-see below If treatment provided at initial evaluation, no treatment charged due to lack of authorization.                                                                                                                              PATIENT EDUCATION:  Education details: Access Code: [email or web address] Person educated: Patient Education method: Explanation, Demonstration, and Handouts Education comprehension: verbalized understanding and returned demonstration  HOME EXERCISE PROGRAM: Access Code: [email or web address] URL: [email or web address] Date: 12/13/2023 Prepared by: Tresa Endo  Exercises - Supine Lower Trunk Rotation  - 3 x daily - 7 x weekly - 1 sets - 3 reps - 20 hold - Hooklying Single Knee to Chest  - 3 x daily - 7 x weekly - 1 sets - 3 reps - 20 hold - Seated Hamstring Stretch  - 3 x daily - 7 x weekly - 1 sets - 3 reps - 20 hold - Seated Piriformis Stretch with Trunk Bend  - 3 x daily - 7  x weekly - 1 sets - 3 reps - 20 hold  ASSESSMENT:  CLINICAL IMPRESSION: Patient is a 43 y.o. female who was seen today for physical therapy evaluation and treatment for LBP. Pt reports she has  had pain for years with most recent flare-up in November.  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  Pt with Lt sided lumbar and gluteal pain and rates the pain as 0-8/10 and is most painful with sitting.  Pt with normal ROM with Lt sided pain, limited lumbar mobility and tension in Lt lumbar musculature.  Patient will benefit from skilled PT to address the below impairments and improve overall function.   OBJECTIVE IMPAIRMENTS: decreased activity tolerance, decreased endurance, difficulty walking, decreased ROM, decreased strength, increased muscle spasms, impaired flexibility, postural dysfunction, and pain.   ACTIVITY LIMITATIONS: sitting and sleeping  PARTICIPATION LIMITATIONS: meal prep, cleaning, driving, shopping, and community activity  PERSONAL FACTORS: Time since onset of injury/illness/exacerbation and 1 comorbidity: lumbar stenosis   are also affecting patient's functional outcome.   REHAB POTENTIAL: Good  CLINICAL DECISION MAKING: Stable/uncomplicated  EVALUATION COMPLEXITY: Low   GOALS: Goals reviewed with patient? Yes  SHORT TERM GOALS: Target date: 01/10/2024    Be independent with initial HEP Baseline: Goal status: INITIAL  2.  Report > or = to 25% reduction in LBP with daily tasks  Baseline:  Goal status: INITIAL  3.  Improve ODI to < or = to 20% disability (10/50) Baseline: 36%  Goal status: INITIAL  4.  Demonstrate body mechanics modifications for daily and work tasks for lumbar protection Baseline:  Goal status: INITIAL   LONG TERM GOALS: Target date: 02/07/2024    Be independent advanced HEP Baseline:  Goal status: INITIAL  2.   Report > or = to 60% reduction in LBP with daily tasks  Baseline:  Goal status: INITIAL  3.  Improve ODI to < or = to 14% disability (10/50) Baseline: 36% Goal status: INITIAL  4.  Sit without limitation due to LBP Baseline: 1-2 hours  Goal status: INITIAL  5.  Report consistency with a gym routine for  strength progression for return to work as CNA Baseline:  Goal status: INITIAL   PLAN:  PT FREQUENCY: 1-2x/week  PT DURATION: 8 weeks  PLANNED INTERVENTIONS: 97110-Therapeutic exercises, 97530- Therapeutic activity, [id number]- Neuromuscular re-education, 97535- Self Care, 16109- Manual therapy, [id number]- Canalith repositioning, [id number]- Aquatic Therapy, 97014- Electrical stimulation (unattended), [id number]- Electrical stimulation (manual), Cryotherapy, and Moist heat.  PLAN FOR NEXT SESSION: begin core and hip strength, body mechanics for lifting, review HEP, DN to low back and gluteals (need to issue handout if we do this)   Solon Palm, PT  12/26/23 8:41 PM   [facility] [address] [place], [place] [address] Phone # [phone] Fax [phone]

## 2023-12-27 ENCOUNTER — Ambulatory Visit: Payer: Medicaid Other | Admitting: Physical Therapy

## 2023-12-27 ENCOUNTER — Telehealth: Payer: Self-pay | Admitting: Physical Therapy

## 2023-12-27 NOTE — Telephone Encounter (Signed)
Phoned patient about 2nd missed appointment. She said she hasn't been getting the text reminders and will check her mychart contact info. We discussed the no show/cancellation policy and she said to cancel the next 2 appointments at 3:30 because she can't get here in time. She plans to return on 2/11 at 4:15 pm.

## 2024-01-01 ENCOUNTER — Encounter: Payer: Medicaid Other | Admitting: Physical Therapy

## 2024-01-07 ENCOUNTER — Encounter: Payer: Medicaid Other | Admitting: Physical Therapy

## 2024-01-15 ENCOUNTER — Encounter: Payer: Medicaid Other | Admitting: Physical Therapy

## 2024-01-22 ENCOUNTER — Encounter: Payer: Medicaid Other | Admitting: Physical Therapy

## 2024-01-24 ENCOUNTER — Encounter: Payer: Medicaid Other | Admitting: Physical Therapy

## 2024-01-30 ENCOUNTER — Encounter: Payer: Medicaid Other | Admitting: Physical Therapy

## 2024-02-04 ENCOUNTER — Encounter: Payer: Medicaid Other | Admitting: Physical Therapy

## 2024-02-06 ENCOUNTER — Encounter: Payer: Medicaid Other | Admitting: Physical Therapy

## 2024-05-12 ENCOUNTER — Ambulatory Visit: Admitting: Obstetrics

## 2024-05-21 ENCOUNTER — Ambulatory Visit: Admitting: Obstetrics

## 2024-05-21 ENCOUNTER — Encounter: Payer: Self-pay | Admitting: Obstetrics

## 2024-05-21 ENCOUNTER — Other Ambulatory Visit (HOSPITAL_COMMUNITY)
Admission: RE | Admit: 2024-05-21 | Discharge: 2024-05-21 | Disposition: A | Discharge status: Home / Self Care | Source: Ambulatory Visit | Attending: Obstetrics | Admitting: Obstetrics

## 2024-05-21 VITALS — BP 111/77 | HR 74 | Ht 66.0 in | Wt 133.0 lb

## 2024-05-21 DIAGNOSIS — Z113 Encounter for screening for infections with a predominantly sexual mode of transmission: Secondary | ICD-10-CM

## 2024-05-21 DIAGNOSIS — N898 Other specified noninflammatory disorders of vagina: Secondary | ICD-10-CM | POA: Diagnosis not present

## 2024-05-21 DIAGNOSIS — M25561 Pain in right knee: Secondary | ICD-10-CM

## 2024-05-21 DIAGNOSIS — Z1239 Encounter for other screening for malignant neoplasm of breast: Secondary | ICD-10-CM | POA: Diagnosis not present

## 2024-05-21 DIAGNOSIS — G8929 Other chronic pain: Secondary | ICD-10-CM

## 2024-05-21 DIAGNOSIS — Z01419 Encounter for gynecological examination (general) (routine) without abnormal findings: Secondary | ICD-10-CM | POA: Diagnosis present

## 2024-05-21 DIAGNOSIS — M25562 Pain in left knee: Secondary | ICD-10-CM

## 2024-05-21 MED ORDER — IBUPROFEN 800 MG PO TABS: 800.0000 mg | ORAL_TABLET | Freq: Three times a day (TID) | ORAL | 5 refills | Status: AC | PRN

## 2024-05-21 NOTE — Progress Notes (Signed)
 C/O joint pain all of the time. Not taking meds for it. Wants STI testing today. Last pap 2023. Last mammo 2023.

## 2024-05-21 NOTE — Progress Notes (Addendum)
 Subjective:        Mckenzie [name] is a 43 y.o. female here for a routine exam.  Current complaints: Vaginal discharge.    Personal health questionnaire:  Is patient [name], have a family history of breast and/or ovarian cancer: no Is there a family history of uterine cancer diagnosed at age < 44, gastrointestinal cancer, urinary tract cancer, family member who is a Personnel officer syndrome-associated carrier: no Is the patient overweight and hypertensive, family history of diabetes, personal history of gestational diabetes, preeclampsia or PCOS: no Is patient over 11, have PCOS,  family history of premature CHD under age 47, diabetes, smoke, have hypertension or peripheral artery disease:  no At any time, has a partner hit, kicked or otherwise hurt or frightened you?: no Over the past 2 weeks, have you felt down, depressed or hopeless?: no Over the past 2 weeks, have you felt little interest or pleasure in doing things?:no   Gynecologic History No LMP recorded (lmp unknown). (Menstrual status: Other). Contraception: abstinence, post menopausal status, and tubal ligation Last Pap: 2023. Results were: normal Last mammogram: 2022. Results were: normal  Obstetric History OB History  Gravida Para Term Preterm AB Living  5 5 5   5   SAB IAB Ectopic Multiple Live Births      5    # Outcome Date GA Lbr Len/2nd Weight Sex Type Anes PTL Lv  5 Term 09/11/14 [redacted]w[redacted]d 20:01 / 00:09 5 lb 9.4 oz (2.535 kg) M Vag-Spont EPI  LIV  4 Term 12/26/08    M Vag-Spont EPI  LIV  3 Term 10/09/04    M Vag-Spont None  LIV  2 Term 04/22/03    M Vag-Spont EPI  LIV  1 Term 10/15/99    F Vag-Spont EPI  LIV    Past Medical History:  Diagnosis Date   Benign essential hypertension with delivery 10/06/2014   Hypertension    Hypertonic, incoordinate, and prolonged uterine contractions 09/11/2014   MDD (major depressive disorder)    NVD (normal vaginal delivery) 09/11/2014   Vaginal Pap smear, abnormal      Past Surgical History:  Procedure Laterality Date   IUD REMOVAL N/A 07/20/2020   Procedure: INTRAUTERINE DEVICE (IUD) REMOVAL;  Surgeon: Jan Mcgill, MD;  Location: [facility] ;  Service: Gynecology;  Laterality: N/A;   LAPAROSCOPIC BILATERAL SALPINGECTOMY Bilateral 07/20/2020   Procedure: LAPAROSCOPIC BILATERAL SALPINGECTOMY;  Surgeon: Jan Mcgill, MD;  Location: [facility];  Service: Gynecology;  Laterality: Bilateral;   LAPAROSCOPIC TUBAL LIGATION Bilateral 07/20/2020   Procedure: LAPAROSCOPIC TUBAL LIGATION;  Surgeon: Jan Mcgill, MD;  Location: [facility];  Service: Gynecology;  Laterality: Bilateral;   TUBAL LIGATION       Current Outpatient Medications:    Multiple Vitamins-Calcium (ONE-A-DAY WOMENS FORMULA PO), Take by mouth., Disp: , Rfl:    BIOTIN PO, Take by mouth. (Patient not taking: Reported on 05/21/2024), Disp: , Rfl:    CALCIUM PO, Take by mouth. (Patient not taking: Reported on 05/21/2024), Disp: , Rfl:    ibuprofen  (ADVIL ) 800 MG tablet, Take 1 tablet (800 mg total) by mouth every 8 (eight) hours as needed for mild pain (pain score 1-3) or moderate pain (pain score 4-6)., Disp: 30 tablet, Rfl: 5   venlafaxine  XR (EFFEXOR -XR) 37.5 MG 24 hr capsule, Take 1 capsule (37.5 mg total) by mouth daily. (Patient not taking: Reported on 05/21/2024), Disp: 30 capsule, Rfl: 3 No Known Allergies  Social History  Tobacco Use   Smoking status: Former    Current packs/day: 0.00    Types: Cigarettes    Quit date: 05/22/2015    Years since quitting: 9.0   Smokeless tobacco: Never  Substance Use Topics   Alcohol use: Yes    Alcohol/week: 0.0 standard drinks of alcohol    Comment: socially    Family History  Problem Relation Age of Onset   Fibromyalgia Cousin       Review of Systems  Constitutional: negative for fatigue and weight loss Respiratory: negative for cough and wheezing Cardiovascular: negative for chest pain,  fatigue and palpitations Gastrointestinal: negative for abdominal pain and change in bowel habits Musculoskeletal:negative for myalgias Neurological: negative for gait problems and tremors Behavioral/Psych: negative for abusive relationship, depression Endocrine: negative for temperature intolerance    Genitourinary: positive for vaginal discharge.  negative for abnormal menstrual periods, genital lesions, hot flashes, sexual problems  Integument/breast: negative for breast lump, breast tenderness, nipple discharge and skin lesion(s)    Objective:       BP 111/77   Pulse 74   Ht 5' 6 (1.676 m)   Wt 133 lb (60.3 kg)   LMP  (LMP Unknown)   BMI 21.47 kg/m  General:   Alert and no distress  Skin:   no rash or abnormalities  Lungs:   clear to auscultation bilaterally  Heart:   regular rate and rhythm, S1, S2 normal, no murmur, click, rub or gallop  Breasts:   normal without suspicious masses, skin or nipple changes or axillary nodes  Abdomen:  normal findings: no organomegaly, soft, non-tender and no hernia  Pelvis:  External genitalia: normal general appearance Urinary system: urethral meatus normal and bladder without fullness, nontender Vaginal: normal without tenderness, induration or masses Cervix: normal appearance Adnexa: normal bimanual exam Uterus: anteverted and non-tender, normal size   Lab Review Urine pregnancy test Labs reviewed yes Radiologic studies reviewed yes  I have spent a total of 20 minutes of face-to-face time, excluding clinical staff time, reviewing notes and preparing to see patient, ordering tests and/or medications, and counseling the patient.   Assessment:    1. Encounter for routine gynecological examination with Papanicolaou smear of cervix (Primary) Rx: - Cytology - PAP  2. Vaginal discharge Rx: - Cervicovaginal ancillary only( [facility])  3. Screen for STD (sexually transmitted disease) Rx: - Hepatitis B surface antigen - Hepatitis  C Antibody - RPR - HIV Antibody (routine testing w rflx)  4. Screening breast examination Rx: - MM Digital Screening; Future  5. Chronic pain of both knees Rx: - ibuprofen  (ADVIL ) 800 MG tablet; Take 1 tablet (800 mg total) by mouth every 8 (eight) hours as needed for mild pain (pain score 1-3) or moderate pain (pain score 4-6).  Dispense: 30 tablet; Refill: 5 - AMB referral to orthopedics     Plan:    Education reviewed: calcium supplements, depression evaluation, low fat, low cholesterol diet, safe sex/STD prevention, self breast exams, and weight bearing exercise. Contraception: abstinence, post menopausal status, and tubal ligation. Mammogram ordered. Follow up in: 1 year.   Meds ordered this encounter  Medications   ibuprofen  (ADVIL ) 800 MG tablet    Sig: Take 1 tablet (800 mg total) by mouth every 8 (eight) hours as needed for mild pain (pain score 1-3) or moderate pain (pain score 4-6).    Dispense:  30 tablet    Refill:  5   Orders Placed This Encounter  Procedures   MM Digital  Screening    Standing Status:   Future    Expiration Date:   05/21/2025    Reason for Exam (SYMPTOM  OR DIAGNOSIS REQUIRED):   Screeningt    Is the patient pregnant?:   No    Preferred imaging location?:   [facility]   Hepatitis B surface antigen   Hepatitis C Antibody   RPR   HIV Antibody (routine testing w rflx)   AMB referral to orthopedics    Referral Priority:   Routine    Referral Type:   Consultation    Number of Visits Requested:   1    Gabrielle Joiner, MD, FACOG Attending Obstetrician & Gynecologist, [facility] for [facility], [facility], [place] 05/21/24

## 2024-05-22 LAB — CERVICOVAGINAL ANCILLARY ONLY
Bacterial Vaginitis (gardnerella): NEGATIVE
Candida Glabrata: NEGATIVE
Candida Vaginitis: NEGATIVE
Chlamydia: NEGATIVE
Comment: NEGATIVE
Comment: NEGATIVE
Comment: NEGATIVE
Comment: NEGATIVE
Comment: NEGATIVE
Comment: NORMAL
Neisseria Gonorrhea: NEGATIVE
Trichomonas: NEGATIVE

## 2024-05-23 LAB — HEPATITIS C ANTIBODY: Hep C Virus Ab: NONREACTIVE

## 2024-05-23 LAB — HIV ANTIBODY (ROUTINE TESTING W REFLEX): HIV Screen 4th Generation wRfx: NONREACTIVE

## 2024-05-23 LAB — RPR: RPR Ser Ql: NONREACTIVE

## 2024-05-23 LAB — HEPATITIS B SURFACE ANTIGEN: Hepatitis B Surface Ag: NEGATIVE

## 2024-05-27 LAB — CYTOLOGY - PAP
Comment: NEGATIVE
Diagnosis: NEGATIVE
High risk HPV: NEGATIVE

## 2024-06-17 ENCOUNTER — Ambulatory Visit
Admission: RE | Admit: 2024-06-17 | Discharge: 2024-06-17 | Disposition: A | Discharge status: Home / Self Care | Source: Ambulatory Visit | Attending: Obstetrics | Admitting: Obstetrics

## 2024-06-17 DIAGNOSIS — Z1239 Encounter for other screening for malignant neoplasm of breast: Secondary | ICD-10-CM

## 2024-07-02 ENCOUNTER — Encounter: Payer: Self-pay | Admitting: Obstetrics

## 2024-07-11 ENCOUNTER — Other Ambulatory Visit (INDEPENDENT_AMBULATORY_CARE_PROVIDER_SITE_OTHER): Payer: Self-pay

## 2024-07-11 ENCOUNTER — Ambulatory Visit: Admitting: Orthopaedic Surgery

## 2024-07-11 DIAGNOSIS — M25562 Pain in left knee: Secondary | ICD-10-CM

## 2024-07-11 DIAGNOSIS — G8929 Other chronic pain: Secondary | ICD-10-CM

## 2024-07-11 DIAGNOSIS — M25561 Pain in right knee: Secondary | ICD-10-CM | POA: Diagnosis not present

## 2024-07-11 MED ORDER — NAPROXEN 500 MG PO TABS: 500.0000 mg | ORAL_TABLET | Freq: Two times a day (BID) | ORAL | 3 refills | Status: AC

## 2024-07-11 NOTE — Progress Notes (Signed)
 Office Visit Note   Patient: Mckenzie [name]           Date of Birth: [birth year]           MRN: [id number] Visit Date: 07/11/2024              Requested by: Rudy Carlin LABOR, MD [address] [place],  [place] [address] PCP: Towana Small, FNP   Assessment & Plan: Visit Diagnoses:  1. Chronic pain of left knee   2. Chronic pain of right knee     Plan: History of Present Illness Mckenzie [name] is a 43 year old female who presents with bilateral knee pain for evaluation.  She experiences pain in both knees for the past three to four months, with the left knee more affected. The pain is located across the top of both knees and was particularly severe yesterday. There is no swelling, surgery, or injury to the knees. She has not been taking anti-inflammatory medications regularly but has used Voltaren gel with effective relief. Her family history includes rheumatoid arthritis, but no knee replacements.  Physical Exam MUSCULOSKELETAL: No swelling, good flexibility, no tenderness, and intact ligaments in knees.  Results RADIOLOGY Knee X-ray: No structural abnormalities, fractures, dislocations, arthritis, or osteophytes.  Assessment and Plan Bilateral anterior knee pain Chronic bilateral anterior knee pain for 3-4 months, left worse than right. No structural abnormalities on X-ray. Likely patellar maltracking due to quadriceps weakness or imbalance. Not surgical. Cortisone injections not recommended. - Provide knee exercises for strengthening. - Instruct on kinesio tape application for stabilization, refer to YouTube for techniques. - Prescribe naproxen  for inflammation. - Recommend Voltaren gel for pain relief.  Follow-Up Instructions: No follow-ups on file.   Orders:  Orders Placed This Encounter  Procedures   XR KNEE 3 VIEW RIGHT   XR KNEE 3 VIEW LEFT   Meds ordered this encounter  Medications   naproxen  (NAPROSYN ) 500 MG tablet    Sig: Take 1  tablet (500 mg total) by mouth 2 (two) times daily with a meal.    Dispense:  30 tablet    Refill:  3      Procedures: No procedures performed   Clinical Data: No additional findings.   Subjective: Chief Complaint  Patient presents with   Right Knee - Pain   Left Knee - Pain    HPI  Review of Systems  Constitutional: Negative.   HENT: Negative.    Eyes: Negative.   Respiratory: Negative.    Cardiovascular: Negative.   Endocrine: Negative.   Musculoskeletal: Negative.   Neurological: Negative.   Hematological: Negative.   Psychiatric/Behavioral: Negative.    All other systems reviewed and are negative.    Objective: Vital Signs: There were no vitals taken for this visit.  Physical Exam Vitals and nursing note reviewed.  Constitutional:      Appearance: She is well-developed.  HENT:     Head: Atraumatic.     Nose: Nose normal.  Eyes:     Extraocular Movements: Extraocular movements intact.  Cardiovascular:     Pulses: Normal pulses.  Pulmonary:     Effort: Pulmonary effort is normal.  Abdominal:     Palpations: Abdomen is soft.  Musculoskeletal:     Cervical back: Neck supple.  Skin:    General: Skin is warm.     Capillary Refill: Capillary refill takes less than 2 seconds.  Neurological:     Mental Status: She is alert. Mental status is  at baseline.  Psychiatric:        Behavior: Behavior normal.        Thought Content: Thought content normal.        Judgment: Judgment normal.     Ortho Exam  Specialty Comments:  No specialty comments available.  Imaging: XR KNEE 3 VIEW RIGHT Result Date: 07/11/2024 X-rays of the right knee show no acute or structural abnormalities  XR KNEE 3 VIEW LEFT Result Date: 07/11/2024 X-rays of the left knee show no acute or structural abnormalities    PMFS History: Patient Active Problem List   Diagnosis Date Noted   MDD (major depressive disorder), recurrent episode, moderate (HCC) 10/16/2023   Vasomotor  symptoms due to menopause 10/16/2023   Chronic bilateral low back pain without sciatica 10/16/2023   Low serum prolactin 10/16/2023   Past Medical History:  Diagnosis Date   Benign essential hypertension with delivery 10/06/2014   Hypertension    Hypertonic, incoordinate, and prolonged uterine contractions 09/11/2014   MDD (major depressive disorder)    NVD (normal vaginal delivery) 09/11/2014   Vaginal Pap smear, abnormal     Family History  Problem Relation Age of Onset   Fibromyalgia Cousin     Past Surgical History:  Procedure Laterality Date   IUD REMOVAL N/A 07/20/2020   Procedure: INTRAUTERINE DEVICE (IUD) REMOVAL;  Surgeon: Nicholaus Burnard HERO, MD;  Location[facility];  Service: Gynecology;  Laterality: N/A;   LAPAROSCOPIC BILATERAL SALPINGECTOMY Bilateral 07/20/2020   Procedure: LAPAROSCOPIC BILATERAL SALPINGECTOMY;  Surgeon: Nicholaus Burnard HERO, MD;  Location: [facility];  Service: Gynecology;  Laterality: Bilateral;   LAPAROSCOPIC TUBAL LIGATION Bilateral 07/20/2020   Procedure: LAPAROSCOPIC TUBAL LIGATION;  Surgeon: Nicholaus Burnard HERO, MD;  Location: [facility];  Service: Gynecology;  Laterality: Bilateral;   TUBAL LIGATION     Social History   Occupational History   Not on file  Tobacco Use   Smoking status: Former    Current packs/day: 0.00    Types: Cigarettes    Quit date: 05/22/2015    Years since quitting: 9.1   Smokeless tobacco: Never  Vaping Use   Vaping status: Every Day  Substance and Sexual Activity   Alcohol use: Yes    Alcohol/week: 0.0 standard drinks of alcohol    Comment: socially   Drug use: No   Sexual activity: Not Currently    Partners: Male    Birth control/protection: None, Surgical

## 2024-07-30 ENCOUNTER — Encounter: Payer: Self-pay | Admitting: Obstetrics

## 2024-09-04 ENCOUNTER — Ambulatory Visit (HOSPITAL_COMMUNITY)
Admission: EM | Admit: 2024-09-04 | Discharge: 2024-09-04 | Disposition: A | Discharge status: Home / Self Care | Attending: Family Medicine | Admitting: Family Medicine

## 2024-09-04 ENCOUNTER — Encounter (HOSPITAL_COMMUNITY): Payer: Self-pay | Admitting: Emergency Medicine

## 2024-09-04 DIAGNOSIS — N949 Unspecified condition associated with female genital organs and menstrual cycle: Secondary | ICD-10-CM | POA: Diagnosis present

## 2024-09-04 DIAGNOSIS — N898 Other specified noninflammatory disorders of vagina: Secondary | ICD-10-CM | POA: Insufficient documentation

## 2024-09-04 LAB — POCT URINALYSIS DIP (MANUAL ENTRY)
Bilirubin, UA: NEGATIVE
Glucose, UA: NEGATIVE mg/dL
Ketones, POC UA: NEGATIVE mg/dL
Leukocytes, UA: NEGATIVE
Nitrite, UA: NEGATIVE
Protein Ur, POC: NEGATIVE mg/dL
Spec Grav, UA: 1.02 (ref 1.010–1.025)
Urobilinogen, UA: 0.2 U/dL
pH, UA: 6 (ref 5.0–8.0)

## 2024-09-04 NOTE — ED Provider Notes (Signed)
 [facility]    CSN: [id number] Arrival date & time: 09/04/24  1121      History   Chief Complaint Chief Complaint  Patient presents with   Vaginal Discharge    HPI Mckenzie [name] is a 43 y.o. female.   Started on 09/30,  Sexually active, one partner. Had pain after intercourse on Sunday. Had some bleeding after (reports not longer getting peroids- since 3-4 years ago after salpingectomy). Denies hysterectomy, oopherectomy. Early menopause. White-gray discharge this AM, no odor. Dysuria present.   Vaginal Discharge   Past Medical History:  Diagnosis Date   Benign essential hypertension with delivery 10/06/2014   Hypertension    Hypertonic, incoordinate, and prolonged uterine contractions 09/11/2014   MDD (major depressive disorder)    NVD (normal vaginal delivery) 09/11/2014   Vaginal Pap smear, abnormal     Patient Active Problem List   Diagnosis Date Noted   MDD (major depressive disorder), recurrent episode, moderate (HCC) 10/16/2023   Vasomotor symptoms due to menopause 10/16/2023   Chronic bilateral low back pain without sciatica 10/16/2023   Low serum prolactin 10/16/2023    Past Surgical History:  Procedure Laterality Date   IUD REMOVAL N/A 07/20/2020   Procedure: INTRAUTERINE DEVICE (IUD) REMOVAL;  Surgeon: Nicholaus Burnard HERO, MD;  Location: [facility];  Service: Gynecology;  Laterality: N/A;   LAPAROSCOPIC BILATERAL SALPINGECTOMY Bilateral 07/20/2020   Procedure: LAPAROSCOPIC BILATERAL SALPINGECTOMY;  Surgeon: Nicholaus Burnard HERO, MD;  Location: [facility];  Service: Gynecology;  Laterality: Bilateral;   LAPAROSCOPIC TUBAL LIGATION Bilateral 07/20/2020   Procedure: LAPAROSCOPIC TUBAL LIGATION;  Surgeon: Nicholaus Burnard HERO, MD;  Location: [facility];  Service: Gynecology;  Laterality: Bilateral;   TUBAL LIGATION      OB History     Gravida  5   Para  5   Term  5   Preterm      AB      Living   5      SAB      IAB      Ectopic      Multiple      Live Births  5            Home Medications    Prior to Admission medications   Medication Sig Start Date End Date Taking? Authorizing Provider  BIOTIN PO Take by mouth.    [provider]  CALCIUM PO Take by mouth.    [provider]  ibuprofen  (ADVIL ) 800 MG tablet Take 1 tablet (800 mg total) by mouth every 8 (eight) hours as needed for mild pain (pain score 1-3) or moderate pain (pain score 4-6). 05/21/24   Rudy Carlin LABOR, MD  Multiple Vitamins-Calcium (ONE-A-DAY WOMENS FORMULA PO) Take by mouth.    [provider]  naproxen  (NAPROSYN ) 500 MG tablet Take 1 tablet (500 mg total) by mouth 2 (two) times daily with a meal. 07/11/24   Jerri Kay HERO, MD  venlafaxine  XR (EFFEXOR -XR) 37.5 MG 24 hr capsule Take 1 capsule (37.5 mg total) by mouth daily. 12/03/23   Towana Small, FNP    Family History Family History  Problem Relation Age of Onset   Fibromyalgia Cousin     Social History Social History   Tobacco Use   Smoking status: Former    Current packs/day: 0.00    Types: Cigarettes    Quit date: 05/22/2015    Years since quitting: 9.2   Smokeless tobacco: Never  Vaping Use   Vaping status: Every Day  Substance Use Topics   Alcohol use: Yes    Alcohol/week: 0.0 standard drinks of alcohol    Comment: socially   Drug use: No     Allergies   Patient has no known allergies.   Review of Systems Review of Systems  Genitourinary:  Positive for vaginal discharge.     Physical Exam Triage Vital Signs ED Triage Vitals  Encounter Vitals Group     BP 09/04/24 1151 120/79     Girls Systolic BP Percentile --      Girls Diastolic BP Percentile --      Boys Systolic BP Percentile --      Boys Diastolic BP Percentile --      Pulse Rate 09/04/24 1151 82     Resp 09/04/24 1151 16     Temp 09/04/24 1151 98.6 F (37 C)     Temp Source 09/04/24 1151 Oral     SpO2 09/04/24 1151 95  %     Weight --      Height --      Head Circumference --      Peak Flow --      Pain Score 09/04/24 1149 0     Pain Loc --      Pain Education --      Exclude from Growth Chart --    No data found.  Updated Vital Signs BP 120/79 (BP Location: Left Arm)   Pulse 82   Temp 98.6 F (37 C) (Oral)   Resp 16   SpO2 95%   Visual Acuity Right Eye Distance:   Left Eye Distance:   Bilateral Distance:    Right Eye Near:   Left Eye Near:    Bilateral Near:     Physical Exam Constitutional:      Appearance: Normal appearance.  Pulmonary:     Effort: Pulmonary effort is normal.  Genitourinary:    Comments: Chaperone Dr. Vonna present -No discharge present -Normal labia and majora -Bilateral periurethral excoriations of mucous membrane -Speculum exam deferred Skin:    General: Skin is warm and dry.  Neurological:     General: No focal deficit present.     Mental Status: She is alert and oriented to person, place, and time.      UC Treatments / Results  Labs (all labs ordered are listed, but only abnormal results are displayed) Labs Reviewed - No data to display  EKG   Radiology No results found.  Procedures Procedures (including critical care time)  Medications Ordered in UC Medications - No data to display  Initial Impression / Assessment and Plan / UC Course  I have reviewed the triage vital signs and the nursing notes.  Pertinent labs & imaging results that were available during my care of the patient were reviewed by me and considered in my medical decision making (see chart for details).     Well-appearing, afebrile.  GU exam significant for small mucosal tears-suspected 2/2 intercourse.  Suspect dysuria related to mucosal tears.  However given new discharge, will obtain dipstick/Aptima swab.  If results are positive, will call patient with results and treat appropriately.  Follow-up and return precautions discussed.   Final Clinical Impressions(s)  / UC Diagnoses   Final diagnoses:  None   Discharge Instructions   None    ED Prescriptions   None    PDMP not reviewed this encounter.   Howell Lunger, [place] 09/04/24 1219

## 2024-09-04 NOTE — Discharge Instructions (Addendum)
-   You can take ibuprofen /Tylenol  over-the-counter for pain - We are testing some labs today, if they are positive we will call you and send in treatment as needed

## 2024-09-04 NOTE — ED Triage Notes (Signed)
 Pt reports vaginal irritation, redness and vaginal discharge x 2 days. Denies any odor.

## 2024-09-05 LAB — CERVICOVAGINAL ANCILLARY ONLY
Bacterial Vaginitis (gardnerella): NEGATIVE
Candida Glabrata: NEGATIVE
Candida Vaginitis: NEGATIVE
Chlamydia: NEGATIVE
Comment: NEGATIVE
Comment: NEGATIVE
Comment: NEGATIVE
Comment: NEGATIVE
Comment: NEGATIVE
Comment: NORMAL
Neisseria Gonorrhea: NEGATIVE
Trichomonas: NEGATIVE

## 2024-10-06 ENCOUNTER — Encounter: Payer: Self-pay | Admitting: Radiology
